# Patient Record
Sex: Male | Born: 1970
Health system: Southern US, Community
[De-identification: ages and names within clinical notes are randomized; demographics above are authoritative.]

## PROBLEM LIST (undated history)

## (undated) DIAGNOSIS — R000 Tachycardia, unspecified: Secondary | ICD-10-CM

## (undated) DIAGNOSIS — E7800 Pure hypercholesterolemia, unspecified: Secondary | ICD-10-CM

## (undated) DIAGNOSIS — G4733 Obstructive sleep apnea (adult) (pediatric): Secondary | ICD-10-CM

## (undated) DIAGNOSIS — M109 Gout, unspecified: Secondary | ICD-10-CM

## (undated) HISTORY — DX: Obstructive sleep apnea (adult) (pediatric): G47.33

## (undated) HISTORY — PX: PARTIAL KNEE ARTHROPLASTY: SHX2174

## (undated) HISTORY — PX: WISDOM TOOTH EXTRACTION: SHX21

## (undated) HISTORY — DX: Tachycardia, unspecified: R00.0

## (undated) HISTORY — DX: Gout, unspecified: M10.9

---

## 1999-02-01 ENCOUNTER — Emergency Department (HOSPITAL_COMMUNITY): Admission: EM | Admit: 1999-02-01 | Discharge: 1999-02-01 | Payer: Self-pay | Admitting: Emergency Medicine

## 1999-02-01 ENCOUNTER — Encounter: Payer: Self-pay | Admitting: Emergency Medicine

## 2014-06-20 ENCOUNTER — Emergency Department (HOSPITAL_COMMUNITY)
Admission: EM | Admit: 2014-06-20 | Discharge: 2014-06-21 | Disposition: A | Discharge status: Home / Self Care | Payer: 59 | Attending: Emergency Medicine | Admitting: Emergency Medicine

## 2014-06-20 ENCOUNTER — Emergency Department (HOSPITAL_COMMUNITY): Payer: 59

## 2014-06-20 ENCOUNTER — Encounter (HOSPITAL_COMMUNITY): Payer: Self-pay | Admitting: *Deleted

## 2014-06-20 DIAGNOSIS — Z8639 Personal history of other endocrine, nutritional and metabolic disease: Secondary | ICD-10-CM | POA: Insufficient documentation

## 2014-06-20 DIAGNOSIS — R197 Diarrhea, unspecified: Secondary | ICD-10-CM | POA: Diagnosis not present

## 2014-06-20 DIAGNOSIS — R112 Nausea with vomiting, unspecified: Secondary | ICD-10-CM | POA: Diagnosis not present

## 2014-06-20 DIAGNOSIS — R42 Dizziness and giddiness: Secondary | ICD-10-CM | POA: Diagnosis not present

## 2014-06-20 DIAGNOSIS — Z7982 Long term (current) use of aspirin: Secondary | ICD-10-CM | POA: Insufficient documentation

## 2014-06-20 DIAGNOSIS — R002 Palpitations: Secondary | ICD-10-CM | POA: Diagnosis not present

## 2014-06-20 DIAGNOSIS — Z79899 Other long term (current) drug therapy: Secondary | ICD-10-CM | POA: Diagnosis not present

## 2014-06-20 DIAGNOSIS — R55 Syncope and collapse: Secondary | ICD-10-CM | POA: Insufficient documentation

## 2014-06-20 HISTORY — DX: Pure hypercholesterolemia, unspecified: E78.00

## 2014-06-20 LAB — CBC
HCT: 51.5 % (ref 39.0–52.0)
Hemoglobin: 18.6 g/dL — ABNORMAL HIGH (ref 13.0–17.0)
MCH: 29.5 pg (ref 26.0–34.0)
MCHC: 36.1 g/dL — AB (ref 30.0–36.0)
MCV: 81.6 fL (ref 78.0–100.0)
Platelets: 194 10*3/uL (ref 150–400)
RBC: 6.31 MIL/uL — ABNORMAL HIGH (ref 4.22–5.81)
RDW: 13 % (ref 11.5–15.5)
WBC: 15.2 10*3/uL — ABNORMAL HIGH (ref 4.0–10.5)

## 2014-06-20 LAB — BASIC METABOLIC PANEL
Anion gap: 15 (ref 5–15)
BUN: 22 mg/dL (ref 6–23)
CALCIUM: 9.7 mg/dL (ref 8.4–10.5)
CHLORIDE: 102 mmol/L (ref 96–112)
CO2: 22 mmol/L (ref 19–32)
Creatinine, Ser: 1.32 mg/dL (ref 0.50–1.35)
GFR calc Af Amer: 74 mL/min — ABNORMAL LOW (ref >90)
GFR calc non Af Amer: 64 mL/min — ABNORMAL LOW (ref >90)
Glucose, Bld: 121 mg/dL — ABNORMAL HIGH (ref 70–99)
Potassium: 3.5 mmol/L (ref 3.5–5.1)
SODIUM: 139 mmol/L (ref 135–145)

## 2014-06-20 LAB — I-STAT TROPONIN, ED
Troponin i, poc: 0 ng/mL (ref 0.00–0.08)
Troponin i, poc: 0 ng/mL (ref 0.00–0.08)

## 2014-06-20 MED ORDER — ONDANSETRON HCL 4 MG/2ML IJ SOLN
4.0000 mg | Freq: Once | INTRAMUSCULAR | Status: AC
  Administered 2014-06-20: 4 mg via INTRAVENOUS
  Filled 2014-06-20: qty 2

## 2014-06-20 MED ORDER — SODIUM CHLORIDE 0.9 % IV BOLUS (SEPSIS)
1000.0000 mL | Freq: Once | INTRAVENOUS | Status: AC
  Administered 2014-06-20: 1000 mL via INTRAVENOUS

## 2014-06-20 MED ORDER — ACETAMINOPHEN 325 MG PO TABS
650.0000 mg | ORAL_TABLET | Freq: Once | ORAL | Status: AC
  Administered 2014-06-20: 650 mg via ORAL
  Filled 2014-06-20: qty 2

## 2014-06-20 NOTE — ED Notes (Signed)
Pt in c/o palpitations since around 6pm tonight, reports severe dizziness, pt pale on arrival, history of similar episodes but it normally resolves on its own- wife reports she checked his BP and HR at home, HR was 130-140

## 2014-06-20 NOTE — ED Notes (Signed)
While ambulating pt, pt O2 sat stayed at 97% the whole time. The only complaint pt had was a headache.

## 2014-06-20 NOTE — ED Provider Notes (Signed)
CSN: [id number]     Arrival date & time 06/20/14  1927 History   First MD Initiated Contact with Patient 06/20/14 2023     Chief Complaint  Patient presents with  . Palpitations  . Dizziness     Patient is a 44 y.o. male presenting with palpitations and dizziness. The history is provided by the patient.  Palpitations Palpitations quality:  Regular Onset quality:  Sudden Duration:  2 hours Timing:  Constant Progression:  Improving Chronicity:  New Relieved by:  Nothing Worsened by:  Nothing tried Associated symptoms: dizziness, nausea and vomiting   Associated symptoms: no chest pain, no shortness of breath and no syncope   Risk factors: no diabetes mellitus, no heart disease, no hx of DVT and no hx of PE   Dizziness Associated symptoms: diarrhea, nausea, palpitations and vomiting   Associated symptoms: no chest pain, no shortness of breath and no syncope   Patient reports that approximately 2 hours PTA he had onset of palpitations/tachycardia and also feeling dizzy No syncope but felt like he might "pass out" No CP He also reports episodes of vomiting and diarrhea He also reports "itching in my hands" He reports at home his HR>140 and his BP was elevated  He reports he has had this previously.  He has had it intermittently for 10 years He saw a cardiologist previously and had normal stress test He does not recall wearing a holter monitor    Past Medical History  Diagnosis Date  . High cholesterol    History reviewed. No pertinent past surgical history. History reviewed. No pertinent family history. History  Substance Use Topics  . Smoking status: Never Smoker   . Smokeless tobacco: Not on file  . Alcohol Use: Not on file    Review of Systems  Constitutional: Negative for fever.  Respiratory: Negative for shortness of breath.   Cardiovascular: Positive for palpitations. Negative for chest pain and syncope.  Gastrointestinal: Positive for nausea, vomiting and  diarrhea.  Neurological: Positive for dizziness. Negative for syncope.  All other systems reviewed and are negative.     Allergies  Review of patient's allergies indicates no known allergies.  Home Medications   Prior to Admission medications   Medication Sig Start Date End Date Taking? Authorizing Provider  aspirin 81 MG tablet Take 81 mg by mouth daily as needed for pain.   Yes Historical Provider, MD  diphenhydrAMINE (SOMINEX) 25 MG tablet Take 25 mg by mouth at bedtime as needed for allergies.   Yes Historical Provider, MD  febuxostat (ULORIC) 40 MG tablet Take 80 mg by mouth daily.   Yes Historical Provider, MD   BP 124/77 mmHg  Pulse 101  Temp(Src) 97.4 F (36.3 C) (Oral)  Resp 19  Ht  (1.803 m)  Wt 220 lb (99.791 kg)  BMI 30.70 kg/m2  SpO2 100% Physical Exam CONSTITUTIONAL: Well developed/well nourished HEAD: Normocephalic/atraumatic EYES: EOMI/PERRL ENMT: Mucous membranes moist NECK: supple no meningeal signs SPINE/BACK:entire spine nontender CV: tachycardic S1/S2 noted, no murmurs/rubs/gallops noted LUNGS: Lungs are clear to auscultation bilaterally, no apparent distress ABDOMEN: soft, nontender, no rebound or guarding, bowel sounds noted throughout abdomen GU:no cva tenderness NEURO: Pt is awake/alert/appropriate, moves all extremitiesx4.  No facial droop.   EXTREMITIES: pulses normal/equal, full ROM, no calf tenderness is noted SKIN: warm, color normal PSYCH: no abnormalities of mood noted, alert and oriented to situation  ED Course  Procedures   9:41 PM Pt with palpitations/diarrhea/vomiting He denies CP He is in  no distress Will perform 3 hrs troponin to no ensure no signs of MI I doubt PE (denies SOB at this time, has had this previously) 11:38 PM Pt feels much improved He is ambulatory without any hypoxia He denies CP Given h/o palptiations/diarrhea and long h/o this in the past, i doubt acute cardiac dysrhythmia and I doubt acute PE (no CP  reported, no hypoxia on RA when I am in the room, suspect vital tab is error) Pt agreeable with this plan Discussed strict return precautions  Labs Review Labs Reviewed  CBC - Abnormal; Notable for the following:    WBC 15.2 (*)    RBC 6.31 (*)    Hemoglobin 18.6 (*)    MCHC 36.1 (*)    All other components within normal limits  BASIC METABOLIC PANEL - Abnormal; Notable for the following:    Glucose, Bld 121 (*)    GFR calc non Af Amer 64 (*)    GFR calc Af Amer 74 (*)    All other components within normal limits  I-STAT TROPOININ, ED  Rosezena SensorI-STAT TROPOININ, ED    Imaging Review Dg Chest Port 1 View  06/20/2014   CLINICAL DATA:  Chest palpitations beginning at 6:00 p.m. today.  EXAM: PORTABLE CHEST - 1 VIEW  COMPARISON:  None.  FINDINGS: Heart size and mediastinal contours are within normal limits. Both lungs are clear. Visualized skeletal structures are unremarkable.  IMPRESSION: Negative exam.   Electronically Signed   By: Drusilla Kannerhomas  Dalessio M.D.   On: 06/20/2014 19:53     EKG Interpretation   Date/Time:  Saturday June 20 2014 19:32:11 EST Ventricular Rate:  113 PR Interval:  140 QRS Duration: 84 QT Interval:  306 QTC Calculation: 419 R Axis:   38 Text Interpretation:  Sinus tachycardia T wave abnormality, consider  inferior ischemia Abnormal ECG No significant change since last tracing  Confirmed by Bebe ShaggyWICKLINE  MD, Malyiah Fellows ([id number]) on 06/20/2014 8:23:10 PM     Medications  ondansetron (ZOFRAN) injection 4 mg (4 mg Intravenous Given 06/20/14 2001)  sodium chloride 0.9 % bolus 1,000 mL (0 mLs Intravenous Stopped 06/20/14 2201)  acetaminophen (TYLENOL) tablet 650 mg (650 mg Oral Given 06/20/14 2052)  sodium chloride 0.9 % bolus 1,000 mL (0 mLs Intravenous Stopped 06/20/14 2314)    MDM   Final diagnoses:  Palpitations  Diarrhea  Near syncope    Nursing notes including past medical history and social history reviewed and considered in documentation xrays/imaging reviewed by  myself and considered during evaluation Labs/vital reviewed myself and considered during evaluation     Joya Gaskinsonald W Quay Simkin, MD 06/20/14 2340

## 2014-07-29 ENCOUNTER — Ambulatory Visit (INDEPENDENT_AMBULATORY_CARE_PROVIDER_SITE_OTHER): Payer: 59 | Admitting: Neurology

## 2014-07-29 ENCOUNTER — Encounter: Payer: Self-pay | Admitting: Neurology

## 2014-07-29 VITALS — BP 113/79 | HR 74 | Temp 96.9°F | Resp 20 | Ht 71.0 in | Wt 221.0 lb

## 2014-07-29 DIAGNOSIS — R002 Palpitations: Secondary | ICD-10-CM

## 2014-07-29 DIAGNOSIS — R519 Headache, unspecified: Secondary | ICD-10-CM

## 2014-07-29 DIAGNOSIS — E669 Obesity, unspecified: Secondary | ICD-10-CM | POA: Diagnosis not present

## 2014-07-29 DIAGNOSIS — R51 Headache: Secondary | ICD-10-CM | POA: Diagnosis not present

## 2014-07-29 DIAGNOSIS — G4733 Obstructive sleep apnea (adult) (pediatric): Secondary | ICD-10-CM | POA: Diagnosis not present

## 2014-07-29 NOTE — Patient Instructions (Signed)

## 2014-07-29 NOTE — Progress Notes (Signed)
Subjective:    Patient ID: Chad Moran is a 44 y.o. male.  HPI     Chad Foley, MD, PhD [facility] [address] [address] [place], [place] [address]  Dear Dr. Link Moran,   I saw your patient, Chad Moran, upon your kind request in my neurologic clinic today for initial consultation of his sleep disorder, in particular, concern for underlying obstructive sleep apnea. The patient is unaccompanied today. As you know, Chad Moran is a very pleasant 44 year old right-handed gentleman with an underlying medical history of obesity, palpitations, and hypertriglyceridemia, with recent emergency room visit on 06/20/2014 with negative cardiac workup, who was reported to snore and have witnessed apneas while asleep per wife's report. He is significantly sleepy but has a hard time sleeping or napping. He works full-time as a IT sales professional. He snores loudly at times. Often he has more than 5 awakenings at night and is not rested when he first wakes up. He has to get up for work around 6:30 and does not feel good. He has a variable bedtime. He may not fall asleep quickly at all and then may wake up 5-6 times at night. He does not drink a lot of caffeine maybe 1-2 cups per day. He does not smoke and he does not drink alcohol on a regular basis. His father snores loudly but has not been tested for OSA. His mother has a history of lung cancer, thyroid disease, and lupus as well as COPD. He went to the emergency room with dizziness, palpitations, flushing, itching of his palms and hands and nausea as well as significant diarrhea. He has had similar episodes infrequently before. He has seen cardiology some 10 years ago and was told he may have irregular heartbeat. He denies any parasomnias. His Epworth sleepiness score is 17 out of 24 today. He has woken himself up with a sense of gasping for air. He is not sure if he twitches or kicks in his sleep.  His Past Medical History Is  Significant For: Past Medical History  Diagnosis Date  . High cholesterol   . Tachycardia   . Gout     His Past Surgical History Is Significant For: No past surgical history on file.  His Family History Is Significant For: Family History  Problem Relation Age of Onset  . Lung cancer Mother   . Hypertension Mother   . Lupus Mother   . COPD Mother   . Arthritis Father     His Social History Is Significant For: History   Social History  . Marital Status: Married    Spouse Name: Chad Moran  . Number of Children: N/A  . Years of Education: Associates   Occupational History  . [employer]    [employer]   Social History Main Topics  . Smoking status: Never Smoker   . Smokeless tobacco: Not on file  . Alcohol Use: No  . Drug Use: No  . Sexual Activity: Not on file   Other Topics Concern  . None   Social History Narrative   2 caffeine drinks a day (soda or tea)    His Allergies Are:  No Known Allergies:   His Current Medications Are:  Outpatient Encounter Prescriptions as of 07/29/2014  Medication Sig  . aspirin 81 MG tablet Take 81 mg by mouth daily as needed for pain.  . febuxostat (ULORIC) 40 MG tablet Take 80 mg by mouth daily.  . Fenofibrate 120 MG TABS Take by  mouth.  . [DISCONTINUED] diphenhydrAMINE (SOMINEX) 25 MG tablet Take 25 mg by mouth at bedtime as needed for allergies.  :  Review of Systems:  Out of a complete 14 point review of systems, all are reviewed and negative with the exception of these symptoms as listed below:   Review of Systems  Constitutional: Positive for fatigue.  Cardiovascular: Positive for palpitations.  Musculoskeletal:       Joint pain   Neurological:       Insomnia, Sleepiness, Snoring   Psychiatric/Behavioral:       Not enough sleep and decreased energy     Objective:  Neurologic Exam  Physical Exam Physical Examination:   Filed Vitals:   07/29/14 0813  BP: 113/79  Pulse: 74  Temp: 96.9 F (36.1  C)  Resp: 20    General Examination: The patient is a very pleasant 44 y.o. male in no acute distress. He appears well-developed and well-nourished and well groomed.   HEENT: Normocephalic, atraumatic, pupils are equal, round and reactive to light and accommodation. Funduscopic exam is normal with sharp disc margins noted. Extraocular tracking is good without limitation to gaze excursion or nystagmus noted. Normal smooth pursuit is noted. Hearing is grossly intact. Tympanic membranes are clear bilaterally. Face is symmetric with normal facial animation and normal facial sensation. Speech is clear with no dysarthria noted. There is no hypophonia. There is no lip, neck/head, jaw or voice tremor. Neck is supple with full range of passive and active motion. There are no carotid bruits on auscultation. Oropharynx exam reveals: mild mouth dryness, good dental hygiene and moderate airway crowding, due to thicker soft palate and enlarged uvula. Tonsils are 1+ bilaterally. Mallampati is class I.neck size is 16-3/4 inches. Tongue protrudes centrally and palate elevates symmetrically.    Chest: Clear to auscultation without wheezing, rhonchi or crackles noted.  Heart: S1+S2+0, regular and normal without murmurs, rubs or gallops noted.   Abdomen: Soft, non-tender and non-distended with normal bowel sounds appreciated on auscultation.  Extremities: There is no pitting edema in the distal lower extremities bilaterally. Pedal pulses are intact.  Skin: Warm and dry without trophic changes noted. There are no varicose veins.  Musculoskeletal: exam reveals no obvious joint deformities, tenderness or joint swelling or erythema.   Neurologically:  Mental status: The patient is awake, alert and oriented in all 4 spheres. His immediate and remote memory, attention, language skills and fund of knowledge are appropriate. There is no evidence of aphasia, agnosia, apraxia or anomia. Speech is clear with normal prosody  and enunciation. Thought process is linear. Mood is normal and affect is normal.  Cranial nerves II - XII are as described above under HEENT exam. In addition: shoulder shrug is normal with equal shoulder height noted. Motor exam: Normal bulk, strength and tone is noted. There is no drift, tremor or rebound. Romberg is negative. Reflexes are 2+ throughout. Babinski: Toes are flexor bilaterally. Fine motor skills and coordination: intact with normal finger taps, normal hand movements, normal rapid alternating patting, normal foot taps and normal foot agility.  Cerebellar testing: No dysmetria or intention tremor on finger to nose testing. Heel to shin is unremarkable bilaterally. There is no truncal or gait ataxia.  Sensory exam: intact to light touch, pinprick, vibration, temperature sense in the upper and lower extremities.  Gait, station and balance: He stands easily. No veering to one side is noted. No leaning to one side is noted. Posture is age-appropriate and stance is narrow based. Gait shows  normal stride length and normal pace. No problems turning are noted. He turns en bloc. Tandem walk is unremarkable.           Assessment and Plan:  In summary, Chad Moran is a very pleasant 44 y.o.-year old male with an underlying medical history of obesity, palpitations, and hypertriglyceridemia, whose history and physical exam are in keeping with obstructive sleep apnea (OSA). I had a long chat with the patient about my findings and the diagnosis of OSA, its prognosis and treatment options. We talked about medical treatments, surgical interventions and non-pharmacological approaches. I explained in particular the risks and ramifications of untreated moderate to severe OSA, especially with respect to developing cardiovascular disease down the Road, including congestive heart failure, difficult to treat hypertension, cardiac arrhythmias, or stroke. Even type 2 diabetes has, in part, been linked to untreated  OSA. Symptoms of untreated OSA include daytime sleepiness, memory problems, mood irritability and mood disorder such as depression and anxiety, lack of energy, as well as recurrent headaches, especially morning headaches. We talked about trying to maintain a healthy lifestyle in general, as well as the importance of weight control. I encouraged the patient to eat healthy, exercise daily and keep well hydrated, to keep a scheduled bedtime and wake time routine, to not skip any meals and eat healthy snacks in between meals. I advised the patient not to drive when feeling sleepy. I recommended the following at this time: sleep study with potential positive airway pressure titration. (We will score hypopneas at 4% and split the sleep study into diagnostic and treatment portion, if the estimated. 2 hour AHI is >20/h).   I explained the sleep test procedure to the patient and also outlined possible surgical and non-surgical treatment options of OSA, including the use of a custom-made dental device (which would require a referral to a specialist dentist or oral surgeon), upper airway surgical options, such as pillar implants, radiofrequency surgery, tongue base surgery, and UPPP (which would involve a referral to an ENT surgeon). Rarely, jaw surgery such as mandibular advancement may be considered.  I also explained the CPAP treatment option to the patient, who indicated that he would be willing to try CPAP if the need arises. I explained the importance of being compliant with PAP treatment, not only for insurance purposes but primarily to improve His symptoms, and for the patient's long term health benefit, including to reduce His cardiovascular risks. I answered all his questions today and the patient was in agreement. I would like to see him back after the sleep study is completed and encouraged him to call with any interim questions, concerns, problems or updates.   Thank you very much for allowing me to  participate in the care of this nice patient. If I can be of any further assistance to you please do not hesitate to call me at [phone].  Sincerely,   Chad Foley, MD, PhD

## 2014-08-18 ENCOUNTER — Ambulatory Visit (INDEPENDENT_AMBULATORY_CARE_PROVIDER_SITE_OTHER): Payer: 59 | Admitting: Neurology

## 2014-08-18 DIAGNOSIS — G479 Sleep disorder, unspecified: Secondary | ICD-10-CM

## 2014-08-18 DIAGNOSIS — G4733 Obstructive sleep apnea (adult) (pediatric): Secondary | ICD-10-CM | POA: Diagnosis not present

## 2014-08-18 DIAGNOSIS — G4710 Hypersomnia, unspecified: Secondary | ICD-10-CM

## 2014-08-18 DIAGNOSIS — G4730 Sleep apnea, unspecified: Secondary | ICD-10-CM

## 2014-08-19 NOTE — Sleep Study (Signed)
Please see the scanned sleep study interpretation located in the Procedure tab within the Chart Review section. 

## 2014-09-02 ENCOUNTER — Telehealth: Payer: Self-pay | Admitting: Neurology

## 2014-09-02 DIAGNOSIS — G4733 Obstructive sleep apnea (adult) (pediatric): Secondary | ICD-10-CM

## 2014-09-02 NOTE — Telephone Encounter (Signed)
Lafonda MossesDiana:   Please call and notify patient that the recent sleep study confirmed the diagnosis of OSA. He did well with CPAP during the study with improvement of the respiratory events. Therefore, I would like start the patient on CPAP at home. I placed the order in the chart. The patient will need a follow up appointment with me in 8 to 10 weeks post set up that has to be scheduled; please go ahead and schedule while you have the patient on the phone and make sure patient understands the importance of keeping this window for the FU appointment, as it is often an insurance requirement and failing to adhere to this may result in losing coverage for sleep apnea treatment. 15 min follow-up should suffice, unless there is a 30 min FU slot available.  Please re-enforce the importance of compliance with treatment and the need for us to monitor compliance data - again an insurance requirement.  Also remind patient, that any upcoming CPAP machine or mask issues, should be first addressed with the DME company. Please ask if patient has a preference regarding DME company and note on phone note for Onalee HuaDavid. Route ph note to Onalee Huaavid.   David:   Please arrange for CPAP set up at home through a DME company of patient's choice - once Lafonda MossesDiana has spoken to patient - and fax/route report to PCP and referring MD (if other than PCP).    Once Lafonda MossesDiana has spoken to patient and scheduled the FU appt (or the new patient consultation) and Onalee HuaDavid has sent off DME order and scanned the paperwork, Onalee HuaDavid can close this encounter, thanks,   Huston FoleySaima Corene Resnick, MD, PhD [facility]

## 2014-09-03 ENCOUNTER — Encounter: Payer: Self-pay | Admitting: Neurology

## 2014-09-03 NOTE — Telephone Encounter (Signed)
Patient returning call regarding Sleep Study results.

## 2014-09-03 NOTE — Telephone Encounter (Signed)
I spoke to [place], he is aware of sleep study results. He would like to proceed with getting a CPAP machine.

## 2014-09-03 NOTE — Telephone Encounter (Signed)
Spoke with patient and referred [facility] to [facility].

## 2014-09-03 NOTE — Telephone Encounter (Signed)
Called patient to give Sleep study results. No answer and no vm. I will try back later.

## 2014-10-29 ENCOUNTER — Encounter: Payer: Self-pay | Admitting: Neurology

## 2014-11-03 ENCOUNTER — Encounter: Payer: Self-pay | Admitting: Neurology

## 2014-11-03 ENCOUNTER — Ambulatory Visit (INDEPENDENT_AMBULATORY_CARE_PROVIDER_SITE_OTHER): Payer: 59 | Admitting: Neurology

## 2014-11-03 VITALS — BP 123/79 | HR 74 | Ht 71.0 in | Wt 223.0 lb

## 2014-11-03 DIAGNOSIS — E669 Obesity, unspecified: Secondary | ICD-10-CM

## 2014-11-03 DIAGNOSIS — G4733 Obstructive sleep apnea (adult) (pediatric): Secondary | ICD-10-CM | POA: Diagnosis not present

## 2014-11-03 DIAGNOSIS — Z9989 Dependence on other enabling machines and devices: Principal | ICD-10-CM

## 2014-11-03 NOTE — Progress Notes (Signed)
Subjective:    Patient ID: PENIEL HASS is a 44 y.o. male.  HPI     Interim history:   Mr. Granlund is a very pleasant 44 year old right-handed gentleman with an underlying medical history of obesity, palpitations, and hypertriglyceridemia (emergency room visit on 06/20/2014 with negative cardiac workup), who presents for follow-up consultation of his obstructive sleep apnea, after his recent sleep study. The patient is unaccompanied today. I first met him on 07/29/2014 at the request of his primary care physician, at which time the patient reported snoring and witnessed apneas while asleep. I invited him back for sleep study. He had a split-night sleep study on 08/18/2014 and I went over his test results with him in detail today. His baseline sleep efficiency was 67.8% with a latency to sleep of 34.5 minutes and wake after sleep onset of 39 minutes with severe sleep fragmentation noted. The arousal index was 24.5 per hour, sleep distribution showed an increased percentage of stage I sleep, and a prolonged REM latency. He had mild to moderate snoring. He slept primarily on his back. Total AHI was 36.5 per hour. Baseline oxygen saturation 92%, nadir was 86%. He was then titrated on CPAP from a pressure of 5 cm to 11 cm. The AHI was 3.6 per hour and a pressure of 9 cm. REM sleep was not achieved during the treatment portion of the study. No significant PLMS were noted during the study. Based on his test results are prescribed CPAP therapy for home use at a pressure of 9 cm.  Today, 11/03/2014: I reviewed his CPAP compliance data from 09/30/2014 through 10/29/2014 which is a total of 30 days during which time he used his machine every night with percent used days greater than 4 hours at 83%, indicating very good compliance with an average usage of 5 hours and 18 minutes, residual AHI acceptable at 2 per hour, leak acceptable with the 95th percentile at 17.4 L/m on a pressure of 9 cm with EPR of  2.  Today, 11/03/2014: He reports still trying to get adjusted to treatment. He indicates compliance with treatment and has noticed some smaller improvements in his sleep including sleep consolidation. He has not had any recent palpitations. He has had no recent morning headaches. He does admit that he may not allow for enough sleep time. He may average at times only 4 hours of sleep. He is trying to lose weight. He has not noticed a day and night difference but overall is motivated to continue with treatment as he has seen some smaller improvements. He has had some mouth dryness especially when he ran out of water in the humidifier. Otherwise it is okay. He does use a fullface mask at this time which she preferred at the time of set up with CPAP therapy. Sometimes he feels he does not get enough air. We talked about his CPAP compliance data and the residual AHI at 2 per hour indicates an adequate pressure but sometimes patients feel better with a slightly higher pressure. We can certainly increase it for him and see how he feels.  Previously:  He is reported to snore and have witnessed apneas while asleep per wife's report. He is significantly sleepy but has a hard time sleeping or napping. He works full-time as a Airline pilot. He snores loudly at times. Often he has more than 5 awakenings at night and is not rested when he first wakes up. He has to get up for work around 6:30 and does not  feel good. He has a variable bedtime. He may not fall asleep quickly at all and then may wake up 5-6 times at night. He does not drink a lot of caffeine maybe 1-2 cups per day. He does not smoke and he does not drink alcohol on a regular basis. His father snores loudly but has not been tested for OSA. His mother has a history of lung cancer, thyroid disease, and lupus as well as COPD. He went to the emergency room with dizziness, palpitations, flushing, itching of his palms and hands and nausea as well as significant  diarrhea. He has had similar episodes infrequently before. He has seen cardiology some 10 years ago and was told he may have irregular heartbeat. He denies any parasomnias. His Epworth sleepiness score is 17 out of 24 today. He has woken himself up with a sense of gasping for air. He is not sure if he twitches or kicks in his sleep.  His Past Medical History Is Significant For: Past Medical History  Diagnosis Date  . High cholesterol   . Tachycardia   . Gout     His Past Surgical History Is Significant For: Past Surgical History  Procedure Laterality Date  . Wisdom tooth extraction      His Family History Is Significant For: Family History  Problem Relation Age of Onset  . Lung cancer Mother   . Hypertension Mother   . Lupus Mother   . COPD Mother   . Arthritis Father     His Social History Is Significant For: History   Social History  . Marital Status: Married    Spouse Name: Anderson Malta  . Number of Children: 2  . Years of Education: Associates   Occupational History  . [place] History Main Topics  . Smoking status: Never Smoker   . Smokeless tobacco: Never Used  . Alcohol Use: No  . Drug Use: No  . Sexual Activity: Not on file   Other Topics Concern  . None   Social History Narrative   Patient lives at home with his wife Anderson Malta).   Education college.   Left handed.   Caffeine sweet tea three daily.    His Allergies Are:  Allergies  Allergen Reactions  . Naproxen   :   His Current Medications Are:  Outpatient Encounter Prescriptions as of 11/03/2014  Medication Sig  . aspirin 81 MG tablet Take 81 mg by mouth daily as needed for pain.  . febuxostat (ULORIC) 40 MG tablet Take 80 mg by mouth daily.  . Fenofibrate 120 MG TABS Take by mouth.   No facility-administered encounter medications on file as of 11/03/2014.  :  Review of Systems:  Out of a complete 14 point review of systems, all are reviewed and  negative with the exception of these symptoms as listed below:    Review of Systems  Neurological: Positive for headaches.  Psychiatric/Behavioral: Positive for sleep disturbance.    Objective:  Neurologic Exam  Physical Exam Physical Examination:   Filed Vitals:   11/03/14 1138  BP: 123/79  Pulse: 74   General Examination: The patient is a very pleasant 44 y.o. male in no acute distress. He appears well-developed and well-nourished and well groomed.   HEENT: Normocephalic, atraumatic, pupils are equal, round and reactive. Extraocular tracking is good without limitation to gaze excursion or nystagmus noted. Normal smooth pursuit is noted. Hearing is grossly intact. Tympanic membranes are clear bilaterally.  Face is symmetric with normal facial animation and normal facial sensation. Speech is clear with no dysarthria noted. There is no hypophonia. There is no lip, neck/head, jaw or voice tremor. Neck is supple with full range of passive and active motion. There are no carotid bruits on auscultation.   Chest: Clear to auscultation without wheezing, rhonchi or crackles noted.  Heart: S1+S2+0, regular and normal without murmurs, rubs or gallops noted.   Abdomen: Soft, non-tender and non-distended with normal bowel sounds appreciated on auscultation.  Extremities: There is no pitting edema in the distal lower extremities bilaterally.  Skin: Warm and dry without trophic changes noted. There are no varicose veins.  Musculoskeletal: exam reveals no obvious joint deformities, tenderness or joint swelling or erythema.   Neurologically:  Mental status: The patient is awake, alert and oriented in all 4 spheres. His immediate and remote memory, attention, language skills and fund of knowledge are appropriate. There is no evidence of aphasia, agnosia, apraxia or anomia. Speech is clear with normal prosody and enunciation. Thought process is linear. Mood is normal and affect is normal.  Cranial  nerves II - XII are as described above under HEENT exam. In addition: shoulder shrug is normal with equal shoulder height noted. Motor exam: Normal bulk, strength and tone is noted. Fine motor skills and coordination: intact.  Cerebellar testing: No dysmetria or intention tremor on finger to nose testing. Heel to shin is unremarkable bilaterally. There is no truncal or gait ataxia.  Sensory exam: intact to light touch.  Gait, station and balance: Normal.     Assessment and Plan:  In summary, MANNIE OHLIN is a very pleasant 44 year old male with an underlying medical history of obesity, palpitations, and hypertriglyceridemia, who presents for follow-up consultation of his obstructive sleep apnea. He had a recent split-night sleep study on 08/18/2014 and we discussed the findings. Findings were in keeping with severe obstructive sleep disordered breathing based on number of events. He did fairly well with CPAP therapy. He is still adjusted to treatment but has noticed some smaller improvements. He is encouraged to continue with treatment and congratulated for his treatment adherence. He may be able to try nasal mask once he is up for a new mask. He may not need to continue with a full face mask which he initially preferred. For comfort we can certainly try to increase the pressure to 10 cm at this time and we will fax the request to his DME company. His physical exam is stable. I again explained to him the risks and ramifications of untreated moderate to severe OSA, especially with respect to developing cardiovascular disease down the Road, including congestive heart failure, difficult to treat hypertension, cardiac arrhythmias, or stroke. Even type 2 diabetes has, in part, been linked to untreated OSA. Symptoms of untreated OSA include daytime sleepiness, memory problems, mood irritability and mood disorder such as depression and anxiety, lack of energy, as well as recurrent headaches, especially morning  headaches.  We again talked about trying to maintain a healthy lifestyle in general, as well as the importance of weight control. I explained the importance of being compliant with PAP treatment, not only for insurance purposes but primarily to improve His symptoms, and for the patient's long term health benefit, including to reduce His cardiovascular risks. I would like to see him back in 6 months, sooner if the need arises. If he does well I can see him back on a yearly basis after that. I answered all his  questions today and he was in agreement.  I spent 20 minutes in total face-to-face time with the patient, more than 50% of which was spent in counseling and coordination of care, reviewing test results, reviewing medication and discussing or reviewing the diagnosis of OSA, its prognosis and treatment options.

## 2014-11-03 NOTE — Patient Instructions (Signed)

## 2015-04-13 ENCOUNTER — Telehealth: Payer: Self-pay

## 2015-04-13 NOTE — Telephone Encounter (Signed)
Pt called back. This operator moved his appt time to 12/15 at 10am as previous tele note suggested. Pt was ok with the move.

## 2015-04-13 NOTE — Telephone Encounter (Signed)
Left message for patient. He has appt 12/15 at 11:15 for 15min but Dr. Frances FurbishAthar only has 30min appt. I left him a message asking him if we can move appt to 10am on same day or anytime on another day.

## 2015-05-06 ENCOUNTER — Encounter: Payer: Self-pay | Admitting: Neurology

## 2015-05-06 ENCOUNTER — Ambulatory Visit (INDEPENDENT_AMBULATORY_CARE_PROVIDER_SITE_OTHER): Payer: 59 | Admitting: Neurology

## 2015-05-06 ENCOUNTER — Ambulatory Visit: Payer: 59 | Admitting: Neurology

## 2015-05-06 VITALS — BP 121/74 | HR 72 | Resp 16 | Ht 71.0 in | Wt 223.0 lb

## 2015-05-06 DIAGNOSIS — E669 Obesity, unspecified: Secondary | ICD-10-CM

## 2015-05-06 DIAGNOSIS — G4733 Obstructive sleep apnea (adult) (pediatric): Secondary | ICD-10-CM

## 2015-05-06 DIAGNOSIS — Z9989 Dependence on other enabling machines and devices: Principal | ICD-10-CM

## 2015-05-06 NOTE — Progress Notes (Signed)
Subjective:    Patient ID: Chad Moran is a 44 y.o. male.  HPI     Interim history:   Mr. Chad Moran is a very pleasant 44 year old right-handed gentleman with an underlying medical history of obesity, palpitations, and hypertriglyceridemia (emergency room visit on 06/20/2014 with negative cardiac workup), who presents for follow-up consultation of his obstructive sleep apnea, on treatment with CPAP. The patient is unaccompanied today. I last saw him on 11/03/2014, at which time he reported that he was still trying to get adjusted to treatment. He was compliant with treatment. He had noticed some improvement of his sleep including improvement of morning headaches. He was trying to lose weight. He was motivated to continue with treatment.   Today, 05/06/2015: I reviewed his CPAP compliance data from 04/04/2015 through 05/03/2015 which is a total of 30 days during which time he used his machine 26 days with percent used days greater than 4 hours of only 50%, indicating suboptimal compliance with an average usage of 3 hours and 11 minutes only, residual AHI 2.5 per hour, leak acceptable with the 95th percentile at 17.2 L/m on a pressure of 9 cm with EPR of 1.  Today, 05/06/2015: He reports doing fairly well. He is in school for his psychology degree and works full-time as a Airline pilot as well. He is not always getting enough sleep. He is also pulling off his mask in the middle of the night and does not always realize it. He did change his mask to a different full face mask which he likes better than the first one. He also tried a nasal mask but could not tolerate it. He feels that he does better with a full facemask. Nevertheless, sometimes he wakes up with a leak blowing into his face and as a result tightens the mask further. This may be the reason why he pulls his mask off when he is asleep because it may bother him at the time and may be too tight. His stress level is a little bit higher than usual  because of his school. He is planning on changing his classes some to a more manageable degree next semester. Both his kids are in college. His daughter will soon transfer from [place] to [employer]. His son is in [place] and also has a job there.   Previously:   I first met him on 07/29/2014 at the request of his primary care physician, at which time the patient reported snoring and witnessed apneas while asleep. I invited him back for sleep study. He had a split-night sleep study on 08/18/2014 and I went over his test results with him in detail today. His baseline sleep efficiency was 67.8% with a latency to sleep of 34.5 minutes and wake after sleep onset of 39 minutes with severe sleep fragmentation noted. The arousal index was 24.5 per hour, sleep distribution showed an increased percentage of stage I sleep, and a prolonged REM latency. He had mild to moderate snoring. He slept primarily on his back. Total AHI was 36.5 per hour. Baseline oxygen saturation 92%, nadir was 86%. He was then titrated on CPAP from a pressure of 5 cm to 11 cm. The AHI was 3.6 per hour and a pressure of 9 cm. REM sleep was not achieved during the treatment portion of the study. No significant PLMS were noted during the study. Based on his test results are prescribed CPAP therapy for home use at a pressure of 9 cm.  I reviewed his CPAP  compliance data from 09/30/2014 through 10/29/2014 which is a total of 30 days during which time he used his machine every night with percent used days greater than 4 hours at 83%, indicating very good compliance with an average usage of 5 hours and 18 minutes, residual AHI acceptable at 2 per hour, leak acceptable with the 95th percentile at 17.4 L/m on a pressure of 9 cm with EPR of 2.  He is reported to snore and have witnessed apneas while asleep per wife's report. He is significantly sleepy but has a hard time sleeping or napping. He works full-time as a Airline pilot. He snores  loudly at times. Often he has more than 5 awakenings at night and is not rested when he first wakes up. He has to get up for work around 6:30 and does not feel good. He has a variable bedtime. He may not fall asleep quickly at all and then may wake up 5-6 times at night. He does not drink a lot of caffeine maybe 1-2 cups per day. He does not smoke and he does not drink alcohol on a regular basis. His father snores loudly but has not been tested for OSA. His mother has a history of lung cancer, thyroid disease, and lupus as well as COPD. He went to the emergency room with dizziness, palpitations, flushing, itching of his palms and hands and nausea as well as significant diarrhea. He has had similar episodes infrequently before. He has seen cardiology some 10 years ago and was told he may have irregular heartbeat. He denies any parasomnias. His Epworth sleepiness score is 17 out of 24 today. He has woken himself up with a sense of gasping for air. He is not sure if he twitches or kicks in his sleep.   His Past Medical History Is Significant For: Past Medical History  Diagnosis Date  . High cholesterol   . Tachycardia   . Gout     His Past Surgical History Is Significant For: Past Surgical History  Procedure Laterality Date  . Wisdom tooth extraction      His Family History Is Significant For: Family History  Problem Relation Age of Onset  . Lung cancer Mother   . Hypertension Mother   . Lupus Mother   . COPD Mother   . Arthritis Father     His Social History Is Significant For: Social History   Social History  . Marital Status: Married    Spouse Name: Anderson Malta  . Number of Children: 2  . Years of Education: Associates   Occupational History  . [place] History Main Topics  . Smoking status: Never Smoker   . Smokeless tobacco: Never Used  . Alcohol Use: No  . Drug Use: No  . Sexual Activity: Not Asked   Other Topics Concern  . None    Social History Narrative   Patient lives at home with his wife Anderson Malta).   Education college.   Left handed.   Caffeine sweet tea three daily.    His Allergies Are:  Allergies  Allergen Reactions  . Naproxen   :   His Current Medications Are:  Outpatient Encounter Prescriptions as of 05/06/2015  Medication Sig  . aspirin 81 MG tablet Take 81 mg by mouth daily as needed for pain.  . febuxostat (ULORIC) 40 MG tablet Take 80 mg by mouth daily.  . Fenofibrate 120 MG TABS Take by mouth.   No facility-administered encounter  medications on file as of 05/06/2015.  :  Review of Systems:  Out of a complete 14 point review of systems, all are reviewed and negative with the exception of these symptoms as listed below:   Review of Systems  Neurological:       Patient is here for f/u. No new concerns or problems.     Objective:  Neurologic Exam  Physical Exam Physical Examination:   Filed Vitals:   05/06/15 0950  BP: 121/74  Pulse: 72  Resp: 16   General Examination: The patient is a very pleasant 44 y.o. male in no acute distress. He appears well-developed and well-nourished and well groomed.   HEENT: Normocephalic, atraumatic, pupils are equal, round and reactive. Extraocular tracking is good without limitation to gaze excursion or nystagmus noted. Normal smooth pursuit is noted. Hearing is grossly intact. Face is symmetric with normal facial animation and normal facial sensation. Speech is clear with no dysarthria noted. There is no hypophonia. There is no lip, neck/head, jaw or voice tremor. Neck is supple with full range of passive and active motion. There are no carotid bruits on auscultation.   Chest: Clear to auscultation without wheezing, rhonchi or crackles noted.  Heart: S1+S2+0, regular and normal without murmurs, rubs or gallops noted.   Abdomen: Soft, non-tender and non-distended with normal bowel sounds appreciated on auscultation.  Extremities: There is no  pitting edema in the distal lower extremities bilaterally.  Skin: Warm and dry without trophic changes noted. There are no varicose veins.  Musculoskeletal: exam reveals no obvious joint deformities, tenderness or joint swelling or erythema.   Neurologically:  Mental status: The patient is awake, alert and oriented in all 4 spheres. His immediate and remote memory, attention, language skills and fund of knowledge are appropriate. There is no evidence of aphasia, agnosia, apraxia or anomia. Speech is clear with normal prosody and enunciation. Thought process is linear. Mood is normal and affect is normal.  Cranial nerves II - XII are as described above under HEENT exam. In addition: shoulder shrug is normal with equal shoulder height noted. Motor exam: Normal bulk, strength and tone is noted. Fine motor skills and coordination: intact. Romberg is negative.  Cerebellar testing: No dysmetria or intention tremor on finger to nose testing. Heel to shin is unremarkable bilaterally. There is no truncal or gait ataxia.  Sensory exam: intact to light touch.  Gait, station and balance: Normal and tandem walk is unremarkable.    Assessment and Plan:  In summary, SYDNEY AZURE is a very pleasant 44 year old male with an underlying medical history of obesity, palpitations, and hypertriglyceridemia, who presents for follow-up consultation of his severe obstructive sleep apnea by number of events, treated with CPAP. He had a split-night sleep study on 08/18/2014 and we reviewed the findings again today. We also reviewed his most recent 30 day compliance data and he has been suboptimally compliant at this time. He was better before, up to 83% compliance. He has had more stress and has had less total sleep time because of working full-time as well as going to school. He is planning on reducing his schoolwork load next semester. He has noticed some  improvements in his sleep. He  tried a nasal mask but likes the full  facemask better. The most recent kind is more tolerable to him. He is advised not to tighten the mass to much. This may be why he pulls off the mask in the middle of the night because it  causes pain and discomfort. We talked about increasing the pressure to 10 cm before but he is well treated on the current pressure and has gotten used to the pressure at this setting. We talked about potentially repeating a sleep study with full night CPAP in the near future. For now, we mutually agreed to monitor symptoms and compliance. He will follow-up in about 6 months, sooner if needed. His physical exam is stable.  We again talked about trying to maintain a healthy lifestyle in general, as well as the importance of weight control. I explained the importance of being compliant with PAP treatment, not only for insurance purposes but primarily to improve His symptoms, and for the patient's long term health benefit, including to reduce His cardiovascular risks. I spent 25 minutes in total face-to-face time with the patient, more than 50% of which was spent in counseling and coordination of care, reviewing test results, reviewing medication and discussing or reviewing the diagnosis of OSA, its prognosis and treatment options.

## 2015-05-06 NOTE — Patient Instructions (Addendum)
Please continue using your CPAP regularly. While your insurance requires that you use CPAP at least 4 hours each night on 70% of the nights, I recommend, that you not skip any nights and use it throughout the night if you can. Getting used to CPAP and staying with the treatment long term does take time and patience and discipline. Untreated obstructive sleep apnea when it is moderate to severe can have an adverse impact on cardiovascular health and raise her risk for heart disease, arrhythmias, hypertension, congestive heart failure, stroke and diabetes. Untreated obstructive sleep apnea causes sleep disruption, nonrestorative sleep, and sleep deprivation. This can have an impact on your day to day functioning and cause daytime sleepiness and impairment of cognitive function, memory loss, mood disturbance, and problems focussing. Using CPAP regularly can improve these symptoms.  Keep up the good work! I will see you back in 6 months for sleep apnea check up, and if you continue to do well on CPAP I will see you once a year thereafter.   Please make enough sleep time and drink more water.

## 2015-08-20 ENCOUNTER — Other Ambulatory Visit: Payer: Self-pay | Admitting: Internal Medicine

## 2015-08-20 DIAGNOSIS — R222 Localized swelling, mass and lump, trunk: Secondary | ICD-10-CM

## 2015-08-23 ENCOUNTER — Ambulatory Visit
Admission: RE | Admit: 2015-08-23 | Discharge: 2015-08-23 | Disposition: A | Discharge status: Home / Self Care | Payer: 59 | Source: Ambulatory Visit | Attending: Internal Medicine | Admitting: Internal Medicine

## 2015-08-23 DIAGNOSIS — R222 Localized swelling, mass and lump, trunk: Secondary | ICD-10-CM

## 2015-08-23 MED ORDER — IOPAMIDOL (ISOVUE-300) INJECTION 61%
75.0000 mL | Freq: Once | INTRAVENOUS | Status: AC | PRN
  Administered 2015-08-23: 75 mL via INTRAVENOUS

## 2015-09-28 ENCOUNTER — Encounter: Payer: Self-pay | Admitting: Neurology

## 2015-10-21 ENCOUNTER — Ambulatory Visit: Payer: 59 | Admitting: Neurology

## 2016-10-30 IMAGING — CR DG CHEST 1V PORT
1 series · 1 of 1 positions shown · non-contrast
Comparison: None.

CLINICAL DATA: Chest palpitations beginning at [DATE] p.m. today.

EXAM:
PORTABLE CHEST - 1 VIEW

[AP]
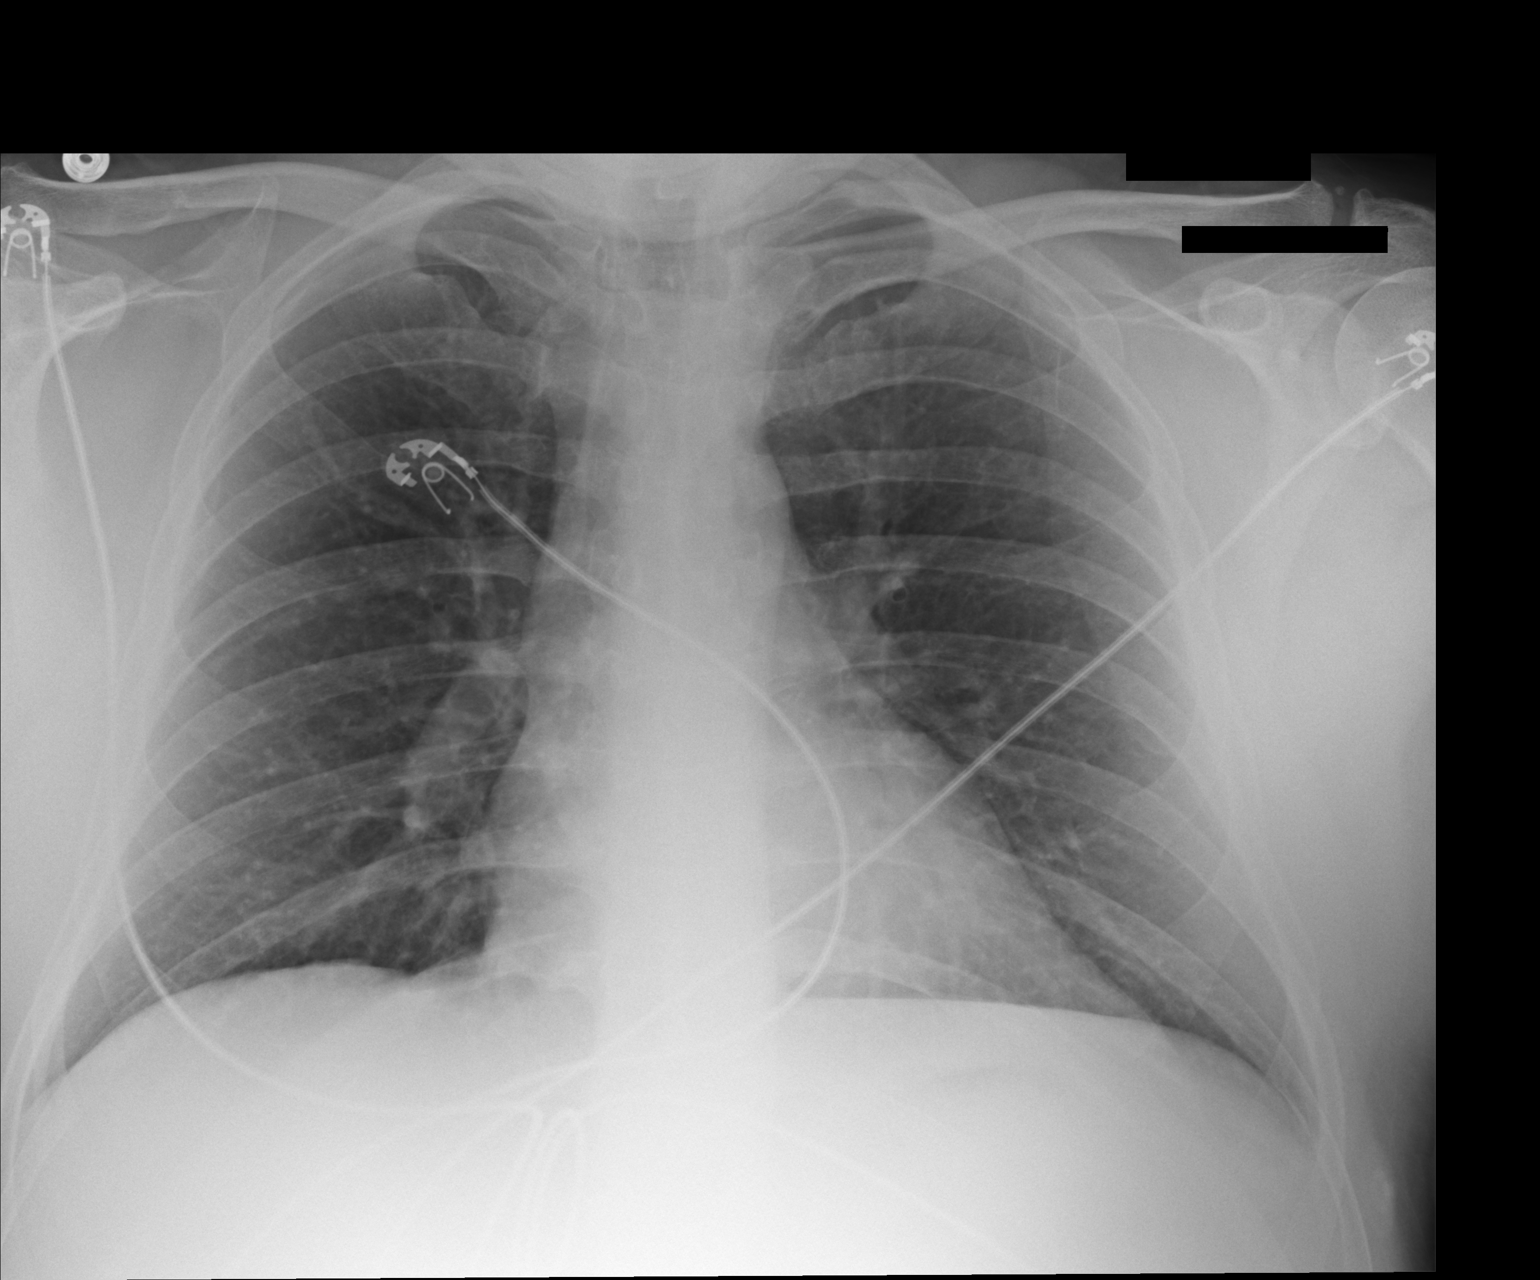

[1 of 1 positions shown; findings below may reference images not displayed]

FINDINGS: Heart size and mediastinal contours are within normal limits. Both
lungs are clear. Visualized skeletal structures are unremarkable.
IMPRESSION: Negative exam.

## 2016-11-06 DIAGNOSIS — Z0000 Encounter for general adult medical examination without abnormal findings: Secondary | ICD-10-CM | POA: Diagnosis not present

## 2016-11-16 ENCOUNTER — Other Ambulatory Visit: Payer: Self-pay | Admitting: Internal Medicine

## 2016-11-16 DIAGNOSIS — R911 Solitary pulmonary nodule: Secondary | ICD-10-CM

## 2016-11-16 DIAGNOSIS — Z1389 Encounter for screening for other disorder: Secondary | ICD-10-CM | POA: Diagnosis not present

## 2016-11-16 DIAGNOSIS — M109 Gout, unspecified: Secondary | ICD-10-CM | POA: Diagnosis not present

## 2016-11-16 DIAGNOSIS — E781 Pure hyperglyceridemia: Secondary | ICD-10-CM | POA: Diagnosis not present

## 2016-11-16 DIAGNOSIS — Z0000 Encounter for general adult medical examination without abnormal findings: Secondary | ICD-10-CM | POA: Diagnosis not present

## 2016-11-16 DIAGNOSIS — Z125 Encounter for screening for malignant neoplasm of prostate: Secondary | ICD-10-CM | POA: Diagnosis not present

## 2016-11-30 ENCOUNTER — Inpatient Hospital Stay
Admission: RE | Admit: 2016-11-30 | Discharge: 2016-11-30 | Disposition: A | Discharge status: Home / Self Care | Payer: 59 | Source: Ambulatory Visit | Attending: Internal Medicine | Admitting: Internal Medicine

## 2016-12-04 ENCOUNTER — Ambulatory Visit
Admission: RE | Admit: 2016-12-04 | Discharge: 2016-12-04 | Disposition: A | Discharge status: Home / Self Care | Payer: 59 | Source: Ambulatory Visit | Attending: Internal Medicine | Admitting: Internal Medicine

## 2016-12-04 DIAGNOSIS — R911 Solitary pulmonary nodule: Secondary | ICD-10-CM

## 2016-12-04 DIAGNOSIS — R918 Other nonspecific abnormal finding of lung field: Secondary | ICD-10-CM | POA: Diagnosis not present

## 2017-01-03 ENCOUNTER — Telehealth: Payer: Self-pay | Admitting: Neurology

## 2017-01-03 NOTE — Telephone Encounter (Signed)
It appears that pt has been scheduled in follow up for 01/08/2017.

## 2017-01-03 NOTE — Telephone Encounter (Signed)
Pt has not been seen in over one year. Last seen in 2016. Pt needs to be seen yearly to receive cpap supplies. Please call him and schedule an appt with Dr. Frances FurbishAthar or one of the NPs.

## 2017-01-03 NOTE — Telephone Encounter (Signed)
Called pt, LVM for him to call to schedule appt with Dr Frances FurbishAthar or either NP's.

## 2017-01-03 NOTE — Telephone Encounter (Signed)
Pt stated that [facility] faxed a paper over for him to be able to get new cpap supplies. Pt said [facility] said they still have not got a reply back. Pt wants to know what is going on

## 2017-01-08 ENCOUNTER — Ambulatory Visit (INDEPENDENT_AMBULATORY_CARE_PROVIDER_SITE_OTHER): Payer: 59 | Admitting: Neurology

## 2017-01-08 ENCOUNTER — Encounter: Payer: Self-pay | Admitting: Neurology

## 2017-01-08 VITALS — BP 134/84 | HR 79 | Resp 16 | Ht 71.0 in | Wt 228.0 lb

## 2017-01-08 DIAGNOSIS — G4733 Obstructive sleep apnea (adult) (pediatric): Secondary | ICD-10-CM | POA: Diagnosis not present

## 2017-01-08 DIAGNOSIS — Z9989 Dependence on other enabling machines and devices: Secondary | ICD-10-CM

## 2017-01-08 NOTE — Progress Notes (Signed)
Subjective:    Patient ID: Chad Moran is a 46 y.o. male.  HPI     Interim history:   Chad Moran is a very pleasant 46 year old right-handed gentleman with an underlying medical history of obesity, palpitations, and hypertriglyceridemia (emergency room visit on 06/20/2014 with negative cardiac workup), who presents for follow-up consultation of his obstructive sleep apnea, on treatment with CPAP. The patient is unaccompanied today and presents after a long gap of over 1-1/2 years. I last saw him on 05/06/2015, at which time he was working on his psychology degree, also working full-time as a Airline pilot. He was not fully compliant with CPAP, more suboptimal. He was planning on making some changes to his schedule to help reduce his stress level. I asked him to be fully compliant with CPAP and follow-up in 6 months.  Today, 01/08/2017: I reviewed his CPAP compliance data from 12/05/2016 through 01/03/2017 which is a total of 30 days, during which time he used his CPAP only 17 days with percent used days greater than 4 hours at 23%, indicating poor compliance with an average usage of 3 hours and 33 minutes, residual AHI of 1.7 per hour, leak on the high side with the 95th percentile at 20.9 L/m on a pressure of 9 cm, in the past 90 days his compliance percentage has been similar at 28%. He reports needing a new mask and supplies. He reports that with allergy flareup and erratic sleep schedule he does not always get enough sleep. He does not always use CPAP because of allergies. He would like to get a second humidifier unit so we can keep one at work. It is difficult for him to carry his machine back and forth. He was also recently diagnosed with low T and given hormone replacement but has not started it yet for fear of side effects.   The patient's allergies, current medications, family history, past medical history, past social history, past surgical history and problem list were reviewed and  updated as appropriate.   Previously (copied from previous notes for reference):   I saw him on 11/03/2014, at which time he reported that he was still trying to get adjusted to treatment. He was compliant with treatment. He had noticed some improvement of his sleep including improvement of morning headaches. He was trying to lose weight. He was motivated to continue with treatment.    I reviewed his CPAP compliance data from 04/04/2015 through 05/03/2015 which is a total of 30 days during which time he used his machine 26 days with percent used days greater than 4 hours of only 50%, indicating suboptimal compliance with an average usage of 3 hours and 11 minutes only, residual AHI 2.5 per hour, leak acceptable with the 95th percentile at 17.2 L/m on a pressure of 9 cm with EPR of 1.     I first met him on 07/29/2014 at the request of his primary care physician, at which time the patient reported snoring and witnessed apneas while asleep. I invited him back for sleep study. He had a split-night sleep study on 08/18/2014 and I went over his test results with him in detail today. His baseline sleep efficiency was 67.8% with a latency to sleep of 34.5 minutes and wake after sleep onset of 39 minutes with severe sleep fragmentation noted. The arousal index was 24.5 per hour, sleep distribution showed an increased percentage of stage I sleep, and a prolonged REM latency. He had mild to moderate snoring. He slept primarily  on his back. Total AHI was 36.5 per hour. Baseline oxygen saturation 92%, nadir was 86%. He was then titrated on CPAP from a pressure of 5 cm to 11 cm. The AHI was 3.6 per hour and a pressure of 9 cm. REM sleep was not achieved during the treatment portion of the study. No significant PLMS were noted during the study. Based on his test results are prescribed CPAP therapy for home use at a pressure of 9 cm.   I reviewed his CPAP compliance data from 09/30/2014 through 10/29/2014 which is a  total of 30 days during which time he used his machine every night with percent used days greater than 4 hours at 83%, indicating very good compliance with an average usage of 5 hours and 18 minutes, residual AHI acceptable at 2 per hour, leak acceptable with the 95th percentile at 17.4 L/m on a pressure of 9 cm with EPR of 2.   He is reported to snore and have witnessed apneas while asleep per wife's report. He is significantly sleepy but has a hard time sleeping or napping. He works full-time as a Airline pilot. He snores loudly at times. Often he has more than 5 awakenings at night and is not rested when he first wakes up. He has to get up for work around 6:30 and does not feel good. He has a variable bedtime. He may not fall asleep quickly at all and then may wake up 5-6 times at night. He does not drink a lot of caffeine maybe 1-2 cups per day. He does not smoke and he does not drink alcohol on a regular basis. His father snores loudly but has not been tested for OSA. His mother has a history of lung cancer, thyroid disease, and lupus as well as COPD. He went to the emergency room with dizziness, palpitations, flushing, itching of his palms and hands and nausea as well as significant diarrhea. He has had similar episodes infrequently before. He has seen cardiology some 10 years ago and was told he may have irregular heartbeat. He denies any parasomnias. His Epworth sleepiness score is 17 out of 24 today. He has woken himself up with a sense of gasping for air. He is not sure if he twitches or kicks in his sleep.   His Past Medical History Is Significant For: Past Medical History:  Diagnosis Date  . Gout   . High cholesterol   . Tachycardia     His Past Surgical History Is Significant For: Past Surgical History:  Procedure Laterality Date  . WISDOM TOOTH EXTRACTION      His Family History Is Significant For: Family History  Problem Relation Age of Onset  . Lung cancer Mother   . Hypertension  Mother   . Lupus Mother   . COPD Mother   . Arthritis Father     His Social History Is Significant For: Social History   Social History  . Marital status: Married    Spouse name: Chad Moran  . Number of children: 2  . Years of education: Associates   Occupational History  . [employer] History Main Topics  . Smoking status: Never Smoker  . Smokeless tobacco: Never Used  . Alcohol use No  . Drug use: No  . Sexual activity: Not on file   Other Topics Concern  . Not on file   Social History Narrative   Patient lives at home with his wife Chad Moran).  Education college.   Left handed.   Caffeine sweet tea three daily.    His Allergies Are:  Allergies  Allergen Reactions  . Naproxen   :   His Current Medications Are:  Outpatient Encounter Prescriptions as of 01/08/2017  Medication Sig  . aspirin 81 MG tablet Take 81 mg by mouth daily as needed for pain.  . febuxostat (ULORIC) 40 MG tablet Take 80 mg by mouth daily.  . Fenofibrate 120 MG TABS Take by mouth.  Marland Kitchen VASCEPA 1 g CAPS Take 2 capsules by mouth 2 (two) times daily.   No facility-administered encounter medications on file as of 01/08/2017.   :  Review of Systems:  Out of a complete 14 point review of systems, all are reviewed and negative with the exception of these symptoms as listed below: Review of Systems  Objective:  Neurological Exam  Physical Exam Physical Examination:   Vitals:   01/08/17 1505  BP: 134/84  Pulse: 79  Resp: 16   General Examination: The patient is a very pleasant 46 y.o. male in no acute distress. He appears well-developed and well groomed.   HEENT: Normocephalic, atraumatic, pupils are equal, round and reactive. Extraocular tracking is good without limitation to gaze excursion or nystagmus noted. Normal smooth pursuit is noted. Hearing is grossly intact. Face is symmetric with normal facial animation and normal facial sensation. Speech is  clear with no dysarthria noted. There is no hypophonia. There is no lip, neck/head, jaw or voice tremor. Neck is supple with full range of passive and active motion. Airway examination is unremarkable, other than mild to moderate airway crowding.  Chest: Clear to auscultation without wheezing, rhonchi or crackles noted.  Heart: S1+S2+0, regular and normal without murmurs, rubs or gallops noted.   Abdomen: Soft, non-tender and non-distended with normal bowel sounds appreciated on auscultation.  Extremities: There is no pitting edema in the distal lower extremities bilaterally.  Skin: Warm and dry without trophic changes noted. There are no varicose veins.  Musculoskeletal: exam reveals no obvious joint deformities, tenderness or joint swelling or erythema.   Neurologically:  Mental status: The patient is awake, alert and oriented in all 4 spheres. His immediate and remote memory, attention, language skills and fund of knowledge are appropriate. There is no evidence of aphasia, agnosia, apraxia or anomia. Speech is clear with normal prosody and enunciation. Thought process is linear. Mood is normal and affect is normal.  Cranial nerves II - XII are as described above under HEENT exam.  Motor exam: Normal bulk, strength and tone is noted. Fine motor skills and coordination: intact. Romberg is negative.  Cerebellar testing: No dysmetria or intention tremor on finger to nose testing. Heel to shin is unremarkable bilaterally. There is no truncal or gait ataxia.  Sensory exam: intact to light touch.  Gait, station and balance: Normal and tandem walk is unremarkable.  Assessment and Plan:  In summary, Chad Moran is a very pleasant 46 year old male with an underlying medical history of obesity, palpitations, and hypertriglyceridemia, who presents for follow-up consultation of his severe obstructive sleep apnea by number of events, treated with CPAP. He had a split-night sleep study on  08/18/2014 and we reviewed his most recent compliance data. He is not compliant with his CPAP at this time, in part because he does not always take it with him to work, he also needs new supplies, his sleep schedule is erratic. He is advised to be consistent with his CPAP because it can  help other symptoms including his daytime energy, low T, metabolism and weight loss endeavors. He is strongly advised to make more time for sleep. Physical exam is stable with the exception of mild weight gain. I prescribed new supplies for his CPAP including a new humidifier unit. Ultimately, he may want to get a second CPAP machine but his insurance may not cover it. I suggested a one-year checkup, sooner as needed. I answered all his questions today and he was in agreement.  I spent 20 minutes in total face-to-face time with the patient, more than 50% of which was spent in counseling and coordination of care, reviewing test results, reviewing medication and discussing or reviewing the diagnosis of OSA its prognosis and treatment options. Pertinent laboratory and imaging test results that were available during this visit with the patient were reviewed by me and considered in my medical decision making (see chart for details).

## 2017-01-08 NOTE — Patient Instructions (Addendum)
Please continue using your CPAP regularly. While your insurance requires that you use CPAP at least 4 hours each night on 70% of the nights, I recommend, that you not skip any nights and use it throughout the night if you can. Getting used to CPAP and staying with the treatment long term does take time and patience and discipline. Untreated obstructive sleep apnea when it is moderate to severe can have an adverse impact on cardiovascular health and raise her risk for heart disease, arrhythmias, hypertension, congestive heart failure, stroke and diabetes. Untreated obstructive sleep apnea causes sleep disruption, nonrestorative sleep, and sleep deprivation. This can have an impact on your day to day functioning and cause daytime sleepiness and impairment of cognitive function, memory loss, mood disturbance, and problems focussing. Using CPAP regularly can improve these symptoms.  You can try Melatonin at night for sleep: take 1 mg to 3 mg, one to 2 hours before your bedtime. You can go up to 5 mg if needed. It is over the counter and comes in pill form, chewable form and spray, if you prefer.        

## 2017-01-17 DIAGNOSIS — G4733 Obstructive sleep apnea (adult) (pediatric): Secondary | ICD-10-CM | POA: Diagnosis not present

## 2017-02-23 NOTE — Telephone Encounter (Signed)
ERROR

## 2017-12-03 DIAGNOSIS — R82998 Other abnormal findings in urine: Secondary | ICD-10-CM | POA: Diagnosis not present

## 2017-12-03 DIAGNOSIS — Z0000 Encounter for general adult medical examination without abnormal findings: Secondary | ICD-10-CM | POA: Diagnosis not present

## 2017-12-03 DIAGNOSIS — M1000 Idiopathic gout, unspecified site: Secondary | ICD-10-CM | POA: Diagnosis not present

## 2017-12-03 DIAGNOSIS — Z125 Encounter for screening for malignant neoplasm of prostate: Secondary | ICD-10-CM | POA: Diagnosis not present

## 2017-12-05 DIAGNOSIS — E781 Pure hyperglyceridemia: Secondary | ICD-10-CM | POA: Diagnosis not present

## 2017-12-05 DIAGNOSIS — Z0000 Encounter for general adult medical examination without abnormal findings: Secondary | ICD-10-CM | POA: Diagnosis not present

## 2017-12-05 DIAGNOSIS — Z1389 Encounter for screening for other disorder: Secondary | ICD-10-CM | POA: Diagnosis not present

## 2018-01-08 ENCOUNTER — Telehealth: Payer: Self-pay

## 2018-01-08 NOTE — Telephone Encounter (Signed)
Pt does not have any wireless cpap data since January of 2019. I called pt, left a detailed message on his cell phone number, per DPR, asking him to bring his cpap to his appt tomorrow and to call me back with any questions or concerns.

## 2018-01-08 NOTE — Telephone Encounter (Signed)
Pt called me back. He reports that he has hurt his back and has been seeing a chiropractor to fix his neck and back issues. The chiropractor told him that he needed to fix his neck and that would help him sleep better. Because of this,pt stopped using his cpap because he was sleeping better. I advised pt that we recommend that pts be re-tested for sleep apnea before deciding if they should stop using a cpap. If pt still has sleep apnea and is not using his cpap, his sleep apnea may be untreated. Pt decided that he would like to start using his cpap again and made an appt for 02/27/18 at 1:00pm and cancelled the appt for tomorrow.

## 2018-01-09 ENCOUNTER — Ambulatory Visit: Payer: 59 | Admitting: Neurology

## 2018-02-26 NOTE — Telephone Encounter (Signed)
I called pt. There is no data from his cpap since January. Pt reports that he has been unable to use his cpap because he has been very busy. Pt's wife has been diagnosed with breast cancer. Pt asked for his appt tomorrow to be cancelled and a new appt was made for 05/20/18 at 8:30am. Pt will start using cpap again in the meantime.

## 2018-02-27 ENCOUNTER — Ambulatory Visit: Payer: Self-pay | Admitting: Neurology

## 2018-05-14 ENCOUNTER — Encounter: Payer: Self-pay | Admitting: Neurology

## 2018-05-20 ENCOUNTER — Ambulatory Visit: Payer: 59 | Admitting: Neurology

## 2018-05-20 ENCOUNTER — Encounter: Payer: Self-pay | Admitting: Neurology

## 2018-05-20 VITALS — BP 131/87 | HR 75 | Ht 71.0 in | Wt 234.0 lb

## 2018-05-20 DIAGNOSIS — G4733 Obstructive sleep apnea (adult) (pediatric): Secondary | ICD-10-CM | POA: Diagnosis not present

## 2018-05-20 DIAGNOSIS — Z9989 Dependence on other enabling machines and devices: Secondary | ICD-10-CM | POA: Diagnosis not present

## 2018-05-20 NOTE — Patient Instructions (Addendum)
Please continue using your CPAP regularly. While your insurance requires that you use CPAP at least 4 hours each night on 70% of the nights, I recommend, that you not skip any nights and use it throughout the night if you can. Getting used to CPAP and staying with the treatment long term does take time and patience and discipline. Untreated obstructive sleep apnea when it is moderate to severe can have an adverse impact on cardiovascular health and raise her risk for heart disease, arrhythmias, hypertension, congestive heart failure, stroke and diabetes. Untreated obstructive sleep apnea causes sleep disruption, nonrestorative sleep, and sleep deprivation. This can have an impact on your day to day functioning and cause daytime sleepiness and impairment of cognitive function, memory loss, mood disturbance, and problems focussing. Using CPAP regularly can improve these symptoms.  I will write for a travel CPAP. [facility] about [facility] and how to go about using your flex account.

## 2018-05-20 NOTE — Progress Notes (Signed)
Subjective:    Patient ID: Chad Moran is a 47 y.o. male.  HPI     Interim history:   Mr. Chad Moran is a very pleasant 47 year old right-handed gentleman with an underlying medical history of obesity, palpitations, and hypertriglyceridemia (emergency room visit on 06/20/2014 with negative cardiac workup), who presents for follow-up consultation of his obstructive sleep apnea, on treatment with CPAP. The patient is unaccompanied today. I last saw him on 01/08/2017, at which time he was not fully compliant with CPAP, he had flareup of allergies needed new supplies. He was diagnosed with low testosterone in the interim.  Today, 05/20/2018: I reviewed his CPAP compliance data from 04/15/2018 through 05/14/2018 which is a total of 30 days, during which time he used his machine 29 days with percent used days greater than 4 hours at 80%, indicating very good compliance with an average usage of 5 hours and 53 minutes, residual AHI at goal at 2.4 per hour, leak on the lower end with the 95th percentile at 7.2 L/m on a pressure of 9 cm. He reports doing well with his CPAP, needs new supplies. He is also interested in getting a secondary CPAP machine. He bought a CPAP cleaning machine. He had a lapse in treatment because of a back injury but is back on CPAP currently. He has had some interim stressors what with his wife's health. They are holding up okay.   The patient's allergies, current medications, family history, past medical history, past social history, past surgical history and problem list were reviewed and updated as appropriate.    Previously (copied from previous notes for reference):   I saw him on 05/06/2015, at which time he was working on his psychology degree, also working full-time as a Airline pilot. He was not fully compliant with CPAP, more suboptimal. He was planning on making some changes to his schedule to help reduce his stress level. I asked him to be fully compliant with CPAP and  follow-up in 6 months.   I reviewed his CPAP compliance data from 12/05/2016 through 01/03/2017 which is a total of 30 days, during which time he used his CPAP only 17 days with percent used days greater than 4 hours at 23%, indicating poor compliance with an average usage of 3 hours and 33 minutes, residual AHI of 1.7 per hour, leak on the high side with the 95th percentile at 20.9 L/m on a pressure of 9 cm, in the past 90 days his compliance percentage has been similar at 28%.   I saw him on 11/03/2014, at which time he reported that he was still trying to get adjusted to treatment. He was compliant with treatment. He had noticed some improvement of his sleep including improvement of morning headaches. He was trying to lose weight. He was motivated to continue with treatment.    I reviewed his CPAP compliance data from 04/04/2015 through 05/03/2015 which is a total of 30 days during which time he used his machine 26 days with percent used days greater than 4 hours of only 50%, indicating suboptimal compliance with an average usage of 3 hours and 11 minutes only, residual AHI 2.5 per hour, leak acceptable with the 95th percentile at 17.2 L/m on a pressure of 9 cm with EPR of 1.     I first met him on 07/29/2014 at the request of his primary care physician, at which time the patient reported snoring and witnessed apneas while asleep. I invited him back for sleep study. He  had a split-night sleep study on 08/18/2014 and I went over his test results with him in detail today. His baseline sleep efficiency was 67.8% with a latency to sleep of 34.5 minutes and wake after sleep onset of 39 minutes with severe sleep fragmentation noted. The arousal index was 24.5 per hour, sleep distribution showed an increased percentage of stage I sleep, and a prolonged REM latency. He had mild to moderate snoring. He slept primarily on his back. Total AHI was 36.5 per hour. Baseline oxygen saturation 92%, nadir was 86%. He was  then titrated on CPAP from a pressure of 5 cm to 11 cm. The AHI was 3.6 per hour and a pressure of 9 cm. REM sleep was not achieved during the treatment portion of the study. No significant PLMS were noted during the study. Based on his test results are prescribed CPAP therapy for home use at a pressure of 9 cm.   I reviewed his CPAP compliance data from 09/30/2014 through 10/29/2014 which is a total of 30 days during which time he used his machine every night with percent used days greater than 4 hours at 83%, indicating very good compliance with an average usage of 5 hours and 18 minutes, residual AHI acceptable at 2 per hour, leak acceptable with the 95th percentile at 17.4 L/m on a pressure of 9 cm with EPR of 2.   He is reported to snore and have witnessed apneas while asleep per wife's report. He is significantly sleepy but has a hard time sleeping or napping. He works full-time as a Airline pilot. He snores loudly at times. Often he has more than 5 awakenings at night and is not rested when he first wakes up. He has to get up for work around 6:30 and does not feel good. He has a variable bedtime. He may not fall asleep quickly at all and then may wake up 5-6 times at night. He does not drink a lot of caffeine maybe 1-2 cups per day. He does not smoke and he does not drink alcohol on a regular basis. His father snores loudly but has not been tested for OSA. His mother has a history of lung cancer, thyroid disease, and lupus as well as COPD. He went to the emergency room with dizziness, palpitations, flushing, itching of his palms and hands and nausea as well as significant diarrhea. He has had similar episodes infrequently before. He has seen cardiology some 10 years ago and was told he may have irregular heartbeat. He denies any parasomnias. His Epworth sleepiness score is 17 out of 24 today. He has woken himself up with a sense of gasping for air. He is not sure if he twitches or kicks in his sleep.  His  Past Medical History Is Significant For: Past Medical History:  Diagnosis Date  . Gout   . High cholesterol   . Tachycardia     His Past Surgical History Is Significant For: Past Surgical History:  Procedure Laterality Date  . WISDOM TOOTH EXTRACTION      His Family History Is Significant For: Family History  Problem Relation Age of Onset  . Lung cancer Mother   . Hypertension Mother   . Lupus Mother   . COPD Mother   . Arthritis Father     His Social History Is Significant For: Social History   Socioeconomic History  . Marital status: Married    Spouse name: Anderson Malta  . Number of children: 2  . Years of education:  Associates  . Highest education level: Not on file  Occupational History  . Occupation: Doctor, hospital: [employer]    Comment: [employer]  Social Needs  . Financial resource strain: Not on file  . Food insecurity:    Worry: Not on file    Inability: Not on file  . Transportation needs:    Medical: Not on file    Non-medical: Not on file  Tobacco Use  . Smoking status: Never Smoker  . Smokeless tobacco: Never Used  Substance and Sexual Activity  . Alcohol use: No    Alcohol/week: 0.0 standard drinks  . Drug use: No  . Sexual activity: Not on file  Lifestyle  . Physical activity:    Days per week: Not on file    Minutes per session: Not on file  . Stress: Not on file  Relationships  . Social connections:    Talks on phone: Not on file    Gets together: Not on file    Attends religious service: Not on file    Active member of club or organization: Not on file    Attends meetings of clubs or organizations: Not on file    Relationship status: Not on file  Other Topics Concern  . Not on file  Social History Narrative   Patient lives at home with his wife Anderson Malta).   Education college.   Left handed.   Caffeine sweet tea three daily.    His Allergies Are:  Allergies  Allergen Reactions  . Naproxen   :   His Current  Medications Are:  Outpatient Encounter Medications as of 05/20/2018  Medication Sig  . febuxostat (ULORIC) 40 MG tablet Take 80 mg by mouth daily.  . Fenofibrate 120 MG TABS Take by mouth.  Marland Kitchen PRESCRIPTION MEDICATION Statin medication. Pt unsure of name.  Marland Kitchen VASCEPA 1 g CAPS Take 2 capsules by mouth 2 (two) times daily.  . [DISCONTINUED] aspirin 81 MG tablet Take 81 mg by mouth daily as needed for pain.   No facility-administered encounter medications on file as of 05/20/2018.   :  Review of Systems:  Out of a complete 14 point review of systems, all are reviewed and negative with the exception of these symptoms as listed below: Review of Systems  Neurological:       Pt presents today to discuss his cpap. Pt took a break from his cpap because he had a back injury but is currently using his cpap again.    Objective:  Neurological Exam  Physical Exam Physical Examination:   Vitals:   05/20/18 0830  BP: 131/87  Pulse: 75   General Examination: The patient is a very pleasant 47 y.o. male in no acute distress. He appears well-developed and well-nourished and well groomed.   HEENT:Normocephalic, atraumatic, pupils are equal, round and reactive. Extraocular tracking is good without limitation to gaze excursion or nystagmus noted. Normal smooth pursuit is noted. Hearing is grossly intact. Face is symmetric with normal facial animation and normal facial sensation. Speech is clear with no dysarthria noted. There is no hypophonia. There is no lip, neck/head, jaw or voice tremor. Neck shows FROM. Airway examination stable.   Chest:Clear to auscultation without wheezing, rhonchi or crackles noted.  Heart:S1+S2+0, regular and normal without murmurs, rubs or gallops noted.   Abdomen:Soft, non-tender and non-distended.  Extremities:There is no pitting edema in the distal lower extremities bilaterally.  Skin: Warm and dry without trophic changes noted. There are no varicose  veins.  Musculoskeletal: exam reveals no obvious joint deformities, tenderness or joint swelling or erythema.   Neurologically:  Mental status: The patient is awake, alert and oriented in all 4 spheres. His immediate and remote memory, attention, language skills and fund of knowledge are appropriate. There is no evidence of aphasia, agnosia, apraxia or anomia. Speech is clear with normal prosody and enunciation. Thought process is linear. Mood is normal and affect is normal.  Cranial nerves II - XII are as described above under HEENT exam.  Motor exam: Normal bulk, strength and tone is noted. Fine motor skills and coordination: grossly intact.   Cerebellar testing: No dysmetria or intention tremor on finger to nose testing. Heel to shin is unremarkable bilaterally. There is no truncal or gait ataxia.  Sensory exam: intact to light touch.  Gait, station and balance: Normal.   Assessment and Plan:  In summary, MILLEDGE GERDING is a very pleasant 47 year old male with an underlying medical history of obesity, palpitations, and hypertriglyceridemia, who presents for follow-up consultation of his OSA, treated with CPAP. He had a split-night sleep study on 08/18/2014 and we reviewed his most recent compliance data today. He is compliant with his CPAP at this time and commended for it. He needs new supplies. I will also write for a travel CPAP. Physical exam is stable. I suggested a one-year checkup, sooner as needed. I answered all his questions today and he was in agreement.  I spent 15 minutes in total face-to-face time with the patient, more than 50% of which was spent in counseling and coordination of care, reviewing test results, reviewing medication and discussing or reviewing the diagnosis of OSA, its prognosis and treatment options. Pertinent laboratory and imaging test results that were available during this visit with the patient were reviewed by me and considered in my medical decision making  (see chart for details).

## 2018-05-20 NOTE — Progress Notes (Signed)
Order for cpap supplies and travel cpap [facility] community [facility]. Confirmation received that the order transmitted was successful.

## 2018-07-01 DIAGNOSIS — G4733 Obstructive sleep apnea (adult) (pediatric): Secondary | ICD-10-CM | POA: Diagnosis not present

## 2019-04-16 IMAGING — CT CT CHEST W/O CM
3 of 4 series · 17 of 30 positions shown, 19 images · non-contrast
Comparison: 08/23/2015

CLINICAL DATA: 45-year-old male with right lung/pleural nodules on
2010 chest CT. Subsequent encounter.

EXAM:
CT CHEST WITHOUT CONTRAST
TECHNIQUE: Multidetector CT imaging of the chest was performed following the
standard protocol without IV contrast.

[Series 3: chest w/o · axial · non-contrast · 0.78mm/px · z∈[-246,-21]mm · 7 of 121 slices shown, 9 images]
[im 16/121  mediastinal]
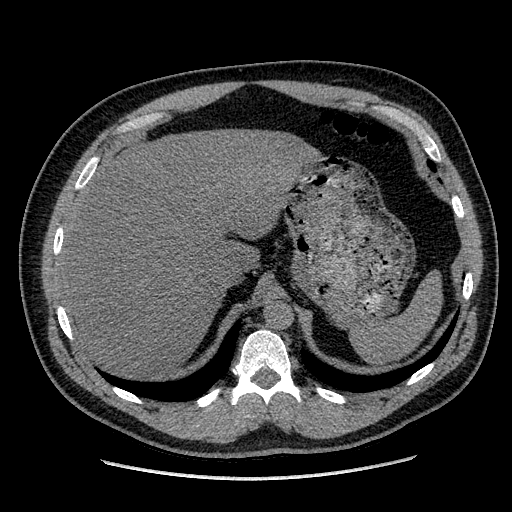
[im 16/121  lung]
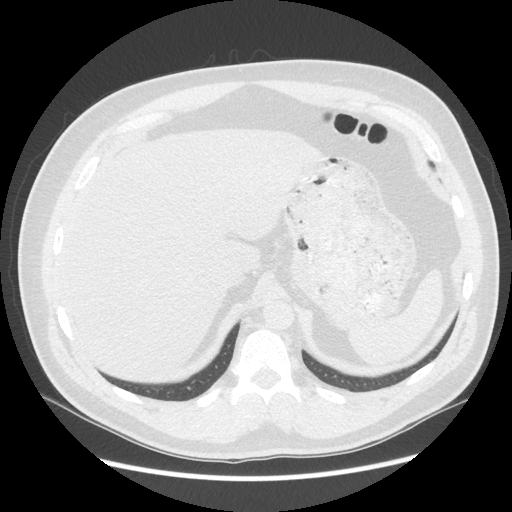
[im 31/121  lung]
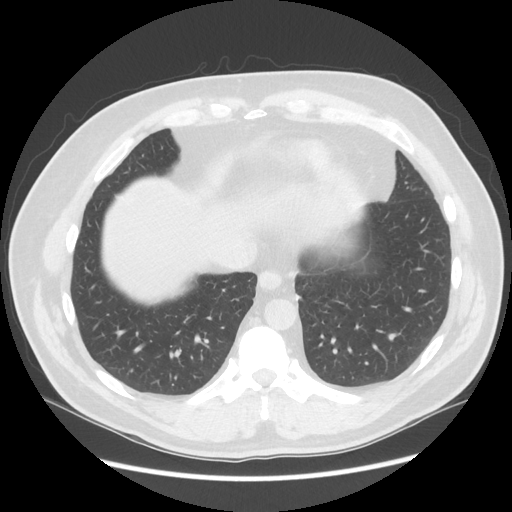
[im 46/121  lung]
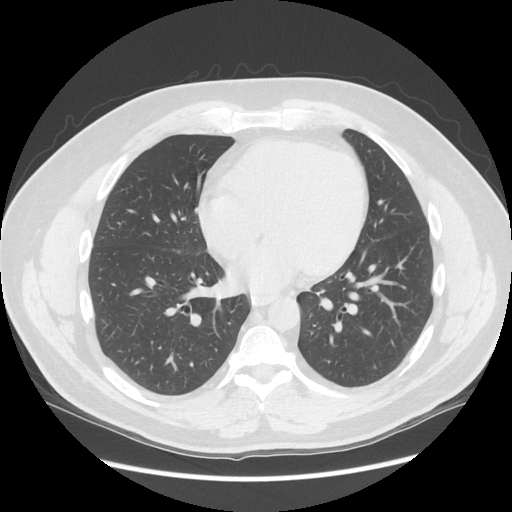
[im 61/121  lung]
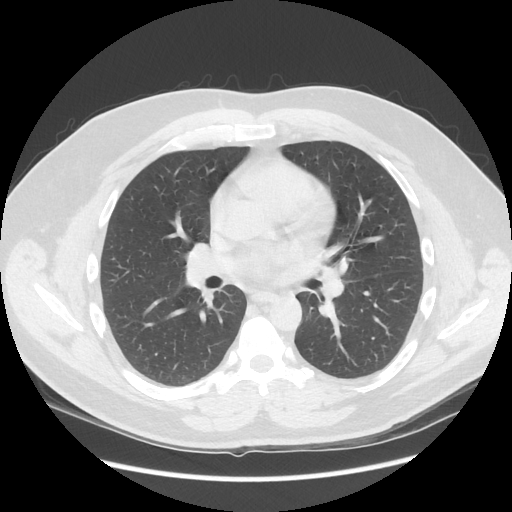
[im 76/121  mediastinal]
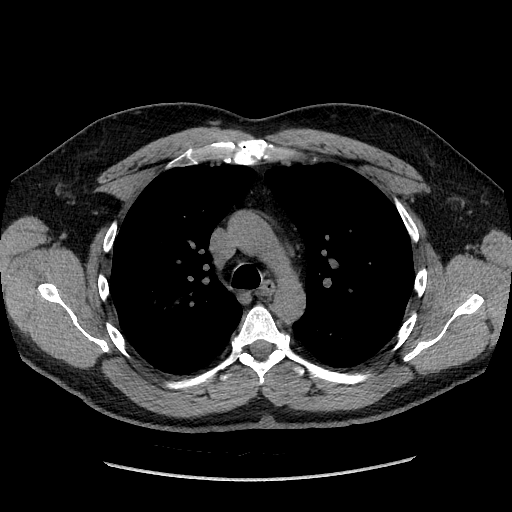
[im 76/121  lung]
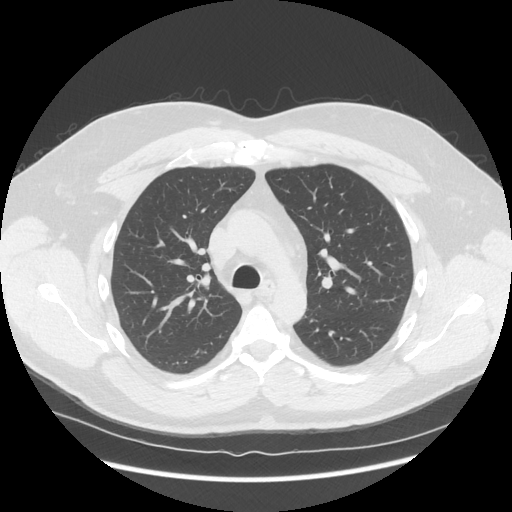
[im 91/121  lung]
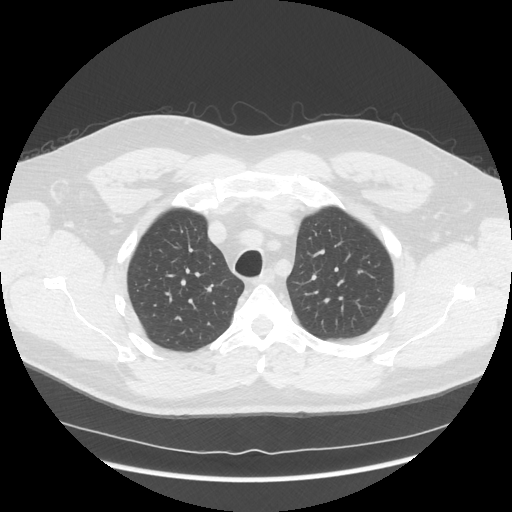
[im 106/121  lung]
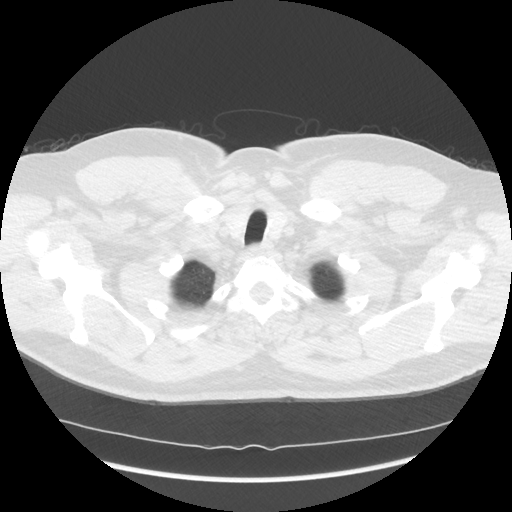

[Series 4: lung windows · axial · 0.78mm/px · z∈[-241,-28]mm · 6 of 121 slices shown]
[im 18/121  lung]
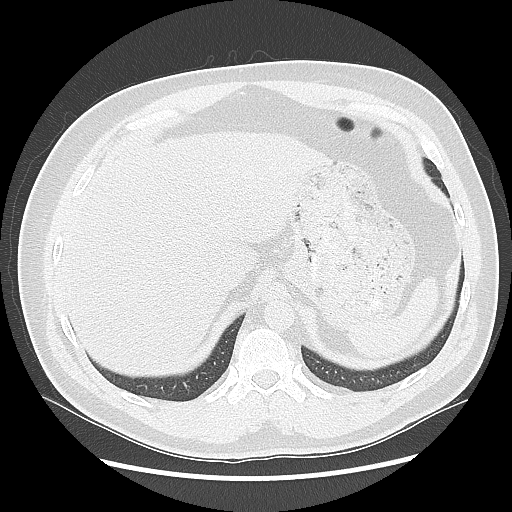
[im 35/121  lung]
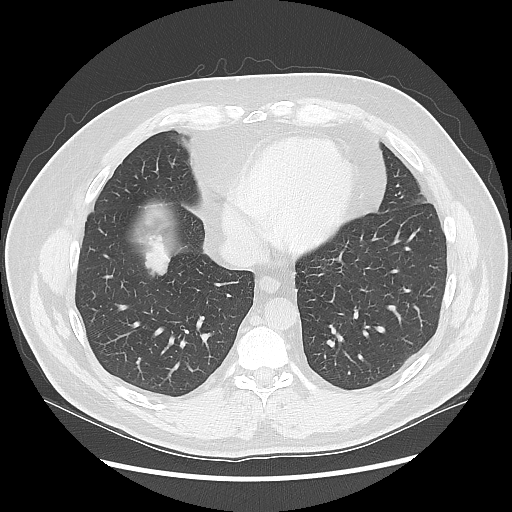
[im 52/121  lung]
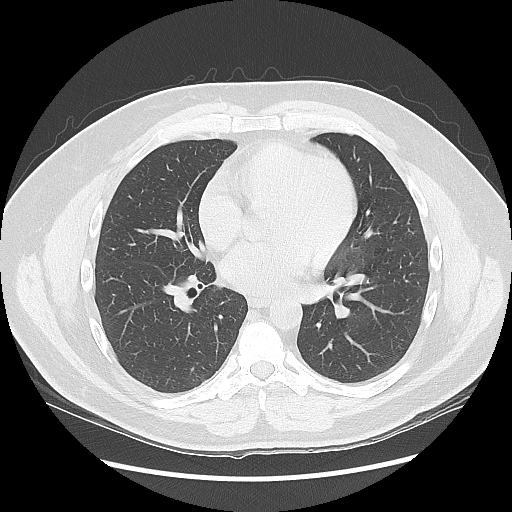
[im 69/121  lung]
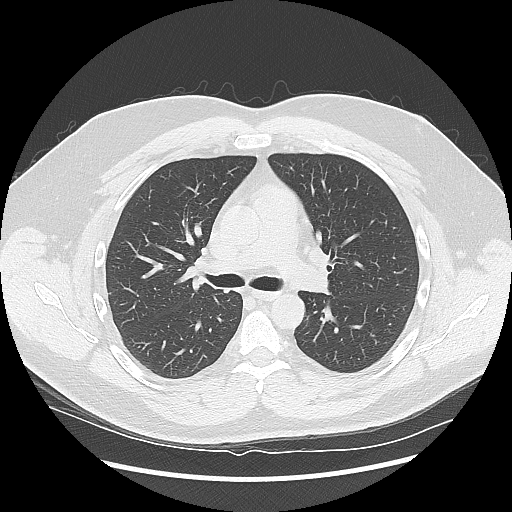
[im 86/121  lung]
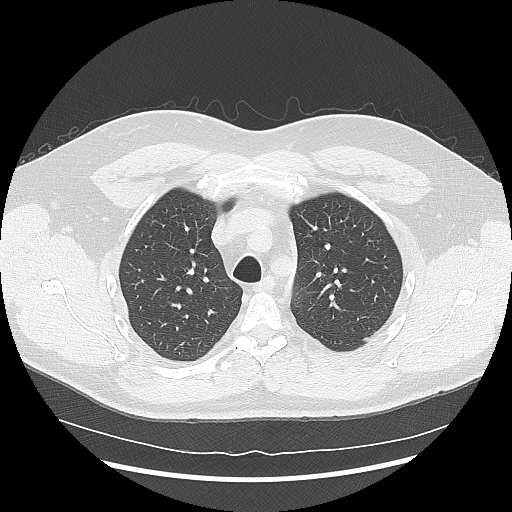
[im 103/121  lung]
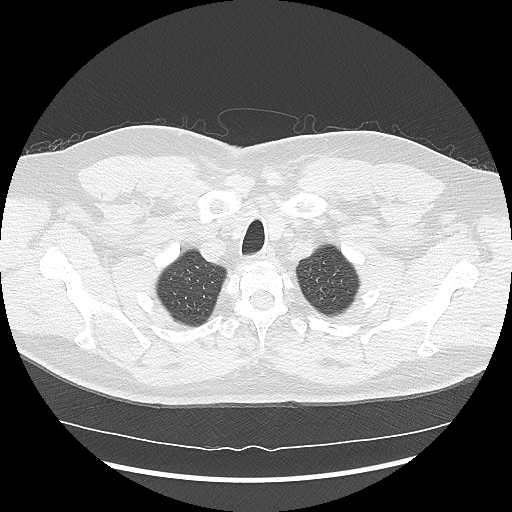

[Series 602: sagittal body · sagittal · 0.78mm/px · 4 of 161 slices shown]
[im 17/161  mediastinal]
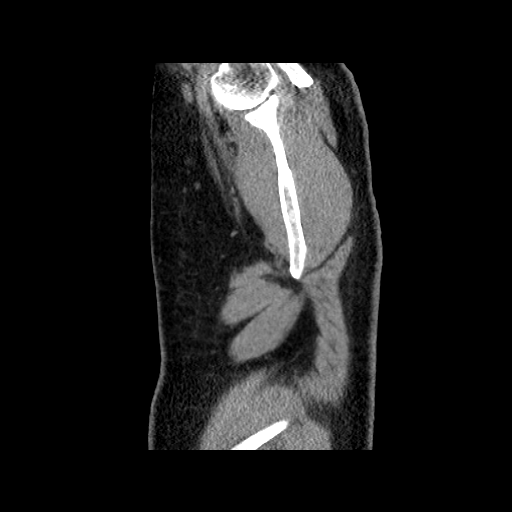
[im 33/161  mediastinal]
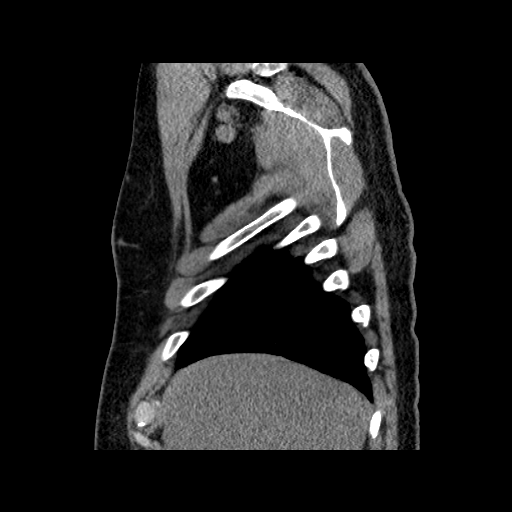
[im 49/161  mediastinal]
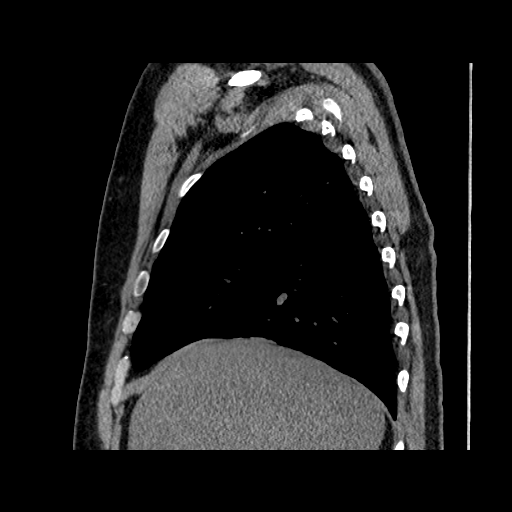
[im 65/161  mediastinal]
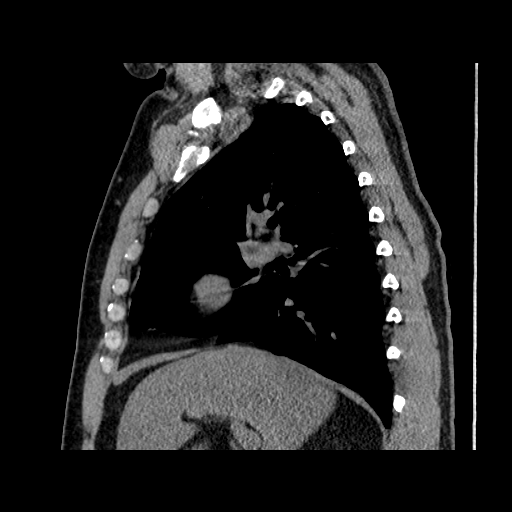

[17 of 30 positions shown; findings below may reference images not displayed]

FINDINGS: Cardiovascular: Calcified left coronary artery atherosclerosis.
Stable cardiac size. No pericardial effusion. Negative visible
noncontrast aorta.

Mediastinum/Nodes: Negative.  No lymphadenopathy.

Lungs/Pleura: Major airways are stable and normal. Stable small 2-3
mm right upper lobe lung nodule on series 4, image 38. Stable 3-4 mm
subpleural nodule in the anterior superior segment of the right
lower lobe on image 53.

Re- identified plaque-like almost 2 cm area of pleural thickening
along the dome of the right diaphragm best seen on coronal image 67
today, and series 4, image 87. This has mildly lobular contours as
before, but is unchanged in size and configuration since August 2015.

No other right lung nodule.

Stable mild pleural thickening along the left major fissure on
series 4, image 66. No other left lung nodule.

No pleural effusion.  No new pulmonary opacity.

Upper Abdomen: Chronic hepatic steatosis. Negative visualized
noncontrast spleen, adrenal glands, pancreas, and bowel in the upper
abdomen.

Musculoskeletal: No acute osseous abnormality identified.
IMPRESSION: 1. Plaque-like area of pleural thickening along the dome of the
right hemidiaphragm is unchanged since August 2015 and appears
benign. Additional follow-up is unlikely to be valuable.
2. Occasional small lung nodules and/or pleural thickening elsewhere
is stable and benign.
3. No acute findings.
4. Calcified left coronary artery atherosclerosis.
5. Hepatic steatosis.

## 2019-05-28 ENCOUNTER — Ambulatory Visit: Payer: 59 | Admitting: Neurology

## 2019-05-28 ENCOUNTER — Other Ambulatory Visit: Payer: Self-pay

## 2019-05-28 ENCOUNTER — Telehealth: Payer: Self-pay | Admitting: Neurology

## 2019-05-28 ENCOUNTER — Encounter: Payer: Self-pay | Admitting: Neurology

## 2019-05-28 VITALS — Temp 97.6°F | Ht 71.0 in | Wt 239.0 lb

## 2019-05-28 DIAGNOSIS — Z9989 Dependence on other enabling machines and devices: Secondary | ICD-10-CM

## 2019-05-28 DIAGNOSIS — G4733 Obstructive sleep apnea (adult) (pediatric): Secondary | ICD-10-CM | POA: Diagnosis not present

## 2019-05-28 NOTE — Progress Notes (Signed)
Subjective:    Patient ID: Chad Moran is a 49 y.o. male.  HPI     Interim history:  Chad Moran is a very pleasant 49 year old right-handed gentleman with an underlying medical history of obesity, palpitations, and hypertriglyceridemia (emergency room visit on 06/20/2014 with negative cardiac workup), who presents for follow-up consultation of his obstructive sleep apnea, on treatment with CPAP. The patient is unaccompanied today and presents for his yearly checkup. I last saw him on 05/20/2018, at which time he was compliant with his CPAP.  He was reporting some interim stressors and flareup of back pain.   Today, 05/28/2019: I reviewed his CPAP compliance data from 04/22/2019 through 05/21/2019 which is a total of 30 days, during which time he used his machine only 13 days with percent use days greater than 4 hours at only 27%, indicating significantly suboptimal compliance especially compared to last year, average usage of 4 hours and 47 minutes for days on treatment, residual AHI at goal at 1.7/h, leak acceptable with a 95th percentile at 9.9 L/min on a pressure of 9 cm with EPR of 1. He reports doing well with his CPAP but he has difficulty taking it back and forth to work.  He would like to have a separate machine for his workplace.  Other than that, he had a lapse in treatment because of lack of supplies, he just got new supplies.  He still benefits from treatment, he is still motivated to continue with it.  He got the Covid vaccine first dose recently had some soreness in his arm but otherwise did well with it, he has not had the second dose yet.  The patient's allergies, current medications, family history, past medical history, past social history, past surgical history and problem list were reviewed and updated as appropriate.    Previously (copied from previous notes for reference):    I saw him on 01/08/2017, at which time he was not fully compliant with CPAP, he had flareup of  allergies needed new supplies. He was diagnosed with low testosterone in the interim.   I reviewed his CPAP compliance data from 04/15/2018 through 05/14/2018 which is a total of 30 days, during which time he used his machine 29 days with percent used days greater than 4 hours at 80%, indicating very good compliance with an average usage of 5 hours and 53 minutes, residual AHI at goal at 2.4 per hour, leak on the lower end with the 95th percentile at 7.2 L/m on a pressure of 9 cm.    I saw him on 05/06/2015, at which time he was working on his psychology degree, also working full-time as a Airline pilot. He was not fully compliant with CPAP, more suboptimal. He was planning on making some changes to his schedule to help reduce his stress level. I asked him to be fully compliant with CPAP and follow-up in 6 months.   I reviewed his CPAP compliance data from 12/05/2016 through 01/03/2017 which is a total of 30 days, during which time he used his CPAP only 17 days with percent used days greater than 4 hours at 23%, indicating poor compliance with an average usage of 3 hours and 33 minutes, residual AHI of 1.7 per hour, leak on the high side with the 95th percentile at 20.9 L/m on a pressure of 9 cm, in the past 90 days his compliance percentage has been similar at 28%.    I saw him on 11/03/2014, at which time he reported that  he was still trying to get adjusted to treatment. He was compliant with treatment. He had noticed some improvement of his sleep including improvement of morning headaches. He was trying to lose weight. He was motivated to continue with treatment.    I reviewed his CPAP compliance data from 04/04/2015 through 05/03/2015 which is a total of 30 days during which time he used his machine 26 days with percent used days greater than 4 hours of only 50%, indicating suboptimal compliance with an average usage of 3 hours and 11 minutes only, residual AHI 2.5 per hour, leak acceptable with the 95th  percentile at 17.2 L/m on a pressure of 9 cm with EPR of 1.     I first met him on 07/29/2014 at the request of his primary care physician, at which time the patient reported snoring and witnessed apneas while asleep. I invited him back for sleep study. He had a split-night sleep study on 08/18/2014 and I went over his test results with him in detail today. His baseline sleep efficiency was 67.8% with a latency to sleep of 34.5 minutes and wake after sleep onset of 39 minutes with severe sleep fragmentation noted. The arousal index was 24.5 per hour, sleep distribution showed an increased percentage of stage I sleep, and a prolonged REM latency. He had mild to moderate snoring. He slept primarily on his back. Total AHI was 36.5 per hour. Baseline oxygen saturation 92%, nadir was 86%. He was then titrated on CPAP from a pressure of 5 cm to 11 cm. The AHI was 3.6 per hour and a pressure of 9 cm. REM sleep was not achieved during the treatment portion of the study. No significant PLMS were noted during the study. Based on his test results are prescribed CPAP therapy for home use at a pressure of 9 cm.   I reviewed his CPAP compliance data from 09/30/2014 through 10/29/2014 which is a total of 30 days during which time he used his machine every night with percent used days greater than 4 hours at 83%, indicating very good compliance with an average usage of 5 hours and 18 minutes, residual AHI acceptable at 2 per hour, leak acceptable with the 95th percentile at 17.4 L/m on a pressure of 9 cm with EPR of 2.   He is reported to snore and have witnessed apneas while asleep per wife's report. He is significantly sleepy but has a hard time sleeping or napping. He works full-time as a Airline pilot. He snores loudly at times. Often he has more than 5 awakenings at night and is not rested when he first wakes up. He has to get up for work around 6:30 and does not feel good. He has a variable bedtime. He may not fall  asleep quickly at all and then may wake up 5-6 times at night. He does not drink a lot of caffeine maybe 1-2 cups per day. He does not smoke and he does not drink alcohol on a regular basis. His father snores loudly but has not been tested for OSA. His mother has a history of lung cancer, thyroid disease, and lupus as well as COPD. He went to the emergency room with dizziness, palpitations, flushing, itching of his palms and hands and nausea as well as significant diarrhea. He has had similar episodes infrequently before. He has seen cardiology some 10 years ago and was told he may have irregular heartbeat. He denies any parasomnias. His Epworth sleepiness score is 17 out of 24  today. He has woken himself up with a sense of gasping for air. He is not sure if he twitches or kicks in his sleep.  His Past Medical History Is Significant For: Past Medical History:  Diagnosis Date  . Gout   . High cholesterol   . Tachycardia     His Past Surgical History Is Significant For: Past Surgical History:  Procedure Laterality Date  . WISDOM TOOTH EXTRACTION      His Family History Is Significant For: Family History  Problem Relation Age of Onset  . Lung cancer Mother   . Hypertension Mother   . Lupus Mother   . COPD Mother   . Arthritis Father     His Social History Is Significant For: Social History   Socioeconomic History  . Marital status: Married    Spouse name: Chad Moran  . Number of children: 2  . Years of education: Associates  . Highest education level: Not on file  Occupational History  . Occupation: Doctor, hospital: [employer]    Comment: [employer]  Tobacco Use  . Smoking status: Never Smoker  . Smokeless tobacco: Never Used  Substance and Sexual Activity  . Alcohol use: No    Alcohol/week: 0.0 standard drinks  . Drug use: No  . Sexual activity: Not on file  Other Topics Concern  . Not on file  Social History Narrative   Patient lives at home with his wife  Chad Moran).   Education college.   Left handed.   Caffeine sweet tea three daily.   Social Determinants of Health   Financial Resource Strain:   . Difficulty of Paying Living Expenses: Not on file  Food Insecurity:   . Worried About Charity fundraiser in the Last Year: Not on file  . Ran Out of Food in the Last Year: Not on file  Transportation Needs:   . Lack of Transportation (Medical): Not on file  . Lack of Transportation (Non-Medical): Not on file  Physical Activity:   . Days of Exercise per Week: Not on file  . Minutes of Exercise per Session: Not on file  Stress:   . Feeling of Stress : Not on file  Social Connections:   . Frequency of Communication with Friends and Family: Not on file  . Frequency of Social Gatherings with Friends and Family: Not on file  . Attends Religious Services: Not on file  . Active Member of Clubs or Organizations: Not on file  . Attends Archivist Meetings: Not on file  . Marital Status: Not on file    His Allergies Are:  Allergies  Allergen Reactions  . Naproxen   :   His Current Medications Are:  Outpatient Encounter Medications as of 05/28/2019  Medication Sig  . febuxostat (ULORIC) 40 MG tablet Take 80 mg by mouth daily.  . Fenofibrate 120 MG TABS Take by mouth.  . rosuvastatin (CRESTOR) 5 MG tablet Take 5 mg by mouth daily at 6 PM.  . VASCEPA 1 g CAPS Take 2 capsules by mouth 2 (two) times daily.  . [DISCONTINUED] PRESCRIPTION MEDICATION Statin medication. Pt unsure of name.   No facility-administered encounter medications on file as of 05/28/2019.  :  Review of Systems:  Out of a complete 14 point review of systems, all are reviewed and negative with the exception of these symptoms as listed below: Review of Systems  Constitutional: Negative.   HENT: Negative.   Eyes: Negative.   Respiratory: Negative.  Cardiovascular: Negative.   Gastrointestinal: Negative.   Endocrine: Negative.   Genitourinary: Negative.    Musculoskeletal: Negative.   Skin: Negative.   Allergic/Immunologic: Negative.   Neurological:       Epworth Sleepiness Scale 0= would never doze 1= slight chance of dozing 2= moderate chance of dozing 3= high chance of dozing  Sitting and reading:2 Watching TV:2 Sitting inactive in a public place (ex. Theater or meeting):2 As a passenger in a car for an hour without a break:0 Lying down to rest in the afternoon:3 Sitting and talking to someone:1 Sitting quietly after lunch (no alcohol):2 In a car, while stopped in traffic:0 Total:12  Hematological: Negative.   Psychiatric/Behavioral: Negative.     Objective:  Neurological Exam  Physical Exam Physical Examination:   Vitals:   05/28/19 0834  Temp: 97.6 F (36.4 C)    General Examination: The patient is a very pleasant 49 y.o. male in no acute distress. He appears well-developed and well-nourished and well groomed.   HEENT:Normocephalic, atraumatic, pupils are equal, round and reactive, extraocular tracking is preserved. Hearing is grossly intact. Face is symmetric with normal facial animation and normal facial sensation. Speech is clear with no dysarthria noted. There is no hypophonia. There is no lip, neck/head, jaw or voice tremor. Neck shows FROM. Airway examination stable.   Chest:Clear to auscultation without wheezing, rhonchi or crackles noted.  Heart:S1+S2+0, regular and normal without murmurs, rubs or gallops noted.   Abdomen:Soft, non-tender and non-distended.  Extremities:There is no pitting edema in the distal lower extremities bilaterally.  Skin: Warm and dry without trophic changes noted. There are no varicose veins.  Musculoskeletal: exam reveals no obvious joint deformities, tenderness or joint swelling or erythema.   Neurologically:  Mental status: The patient is awake, alert and oriented in all 4 spheres. His immediate and remote memory, attention, language skills and fund of knowledge  are appropriate. There is no evidence of aphasia, agnosia, apraxia or anomia. Speech is clear with normal prosody and enunciation. Thought process is linear. Mood is normal and affect is normal.  Cranial nerves II - XII are as described above under HEENT exam.  Motor exam: Normal bulk, strength and tone is noted. Fine motor skills and coordination: grossly intact.   Cerebellar testing: No dysmetria or intention tremor on finger to nose testing. Heel to shin is unremarkable bilaterally. There is no truncal or gait ataxia.  Sensory exam: intact to light touch.  Gait, station and balance: Normal.   Assessment and Plan:  In summary, Chad Moran is a very pleasant 49 year old male with an underlying medical history of obesity, palpitations, and hypertriglyceridemia, who presents for follow-up consultation of his OSA, treated with CPAP. He had a split-night sleep study on 08/18/2014 and we reviewedhis most recent compliance data today. He Has been compliant with treatment with recent labs and treatment noted, he needed new supplies.  He also would like to have a secondary CPAP machine for his workplace.  I provided a prescription for this.  He is advised to be fully compliant with his CPAP and follow-up in 1 year routinely.  I answered all his questions today and he was in agreement.

## 2019-05-28 NOTE — Patient Instructions (Signed)
It was good to see you again today. Please continue using your CPAP regularly. While your insurance requires that you use CPAP at least 4 hours each night on 70% of the nights, I recommend, that you not skip any nights and use it throughout the night if you can. Getting used to CPAP and staying with the treatment long term does take time and patience and discipline. Untreated obstructive sleep apnea when it is moderate to severe can have an adverse impact on cardiovascular health and raise her risk for heart disease, arrhythmias, hypertension, congestive heart failure, stroke and diabetes. Untreated obstructive sleep apnea causes sleep disruption, nonrestorative sleep, and sleep deprivation. This can have an impact on your day to day functioning and cause daytime sleepiness and impairment of cognitive function, memory loss, mood disturbance, and problems focussing. Using CPAP regularly can improve these symptoms.  As discussed, I will write for a second CPAP machine, you can talk to your DME company, [employer] [facility] about getting the second machine so you have 1 for your work place.  Follow-up in 1 year.

## 2019-05-28 NOTE — Telephone Encounter (Signed)
Received a email in regards to the order sent to [employer] [facility]. "I have pulled the paperwork for this patient for a replacement CPAP, however, he is not eligible for insurance to cover a new machine until after 09/08/2019 as that will be when he hits his 5 year mark.  I have talked to the patient and advised him of this and I have put him on my calendar to process this referral when he is eligible in April. "

## 2020-04-12 ENCOUNTER — Ambulatory Visit: Payer: 59 | Admitting: Orthopaedic Surgery

## 2020-04-12 ENCOUNTER — Other Ambulatory Visit: Payer: Self-pay

## 2020-04-12 ENCOUNTER — Ambulatory Visit: Payer: Self-pay

## 2020-04-12 ENCOUNTER — Encounter: Payer: Self-pay | Admitting: Orthopaedic Surgery

## 2020-04-12 DIAGNOSIS — G8929 Other chronic pain: Secondary | ICD-10-CM

## 2020-04-12 DIAGNOSIS — M25461 Effusion, right knee: Secondary | ICD-10-CM | POA: Diagnosis not present

## 2020-04-12 DIAGNOSIS — M25561 Pain in right knee: Secondary | ICD-10-CM | POA: Diagnosis not present

## 2020-04-12 NOTE — Progress Notes (Signed)
Office Visit Note   Patient: Chad Moran           Date of Birth: November 04, 1970           MRN: [id number] Visit Date: 04/12/2020              Requested by: Martha Clan, MD [address] [place],  [place] [address] PCP: Martha Clan, MD   Assessment & Plan: Visit Diagnoses:  1. Chronic pain of right knee   2. Effusion, right knee     Plan: Given that he continues to have medial symptoms for last 4 years and it seems to be slowly worsening, we will obtain a MRI of his right knee to rule out a medial meniscal tear.  He will avoid pivoting activities for now I will see him back for review of the MRI once it is done.  All questions and concerns were answered and addressed.  Follow-Up Instructions: Return in about 3 weeks (around 05/03/2020).   Orders:  Orders Placed This Encounter  Procedures  . XR Knee 1-2 Views Right   No orders of the defined types were placed in this encounter.     Procedures: No procedures performed   Clinical Data: No additional findings.   Subjective: Chief Complaint  Patient presents with  . Right Knee - Pain  The patient comes in today for evaluation treatment of medial knee pain of the right knee.  He just twisted his knee a little bit on the couch last week and got a sharp stabbing pain in the medial aspect of his knee.  This is happened 1 time before.  His knee did swell.  He says it is feeling better than what it did last week.  He denies any significant locking catching but he has had pain at the medial aspect of his right knee with pivoting activities.  He has not had surgery on this knee before.  This has happened in the past.  HPI  Review of Systems He currently denies any headache, chest pain, shortness of breath, fever, chills, nausea, vomiting  Objective: Vital Signs: There were no vitals taken for this visit.  Physical Exam He is alert and orient x3 and in no acute distress Ortho Exam Examination of his right knee today  shows only just a very slight effusion.  There is some medial joint line tenderness but not a true Lachman's or McMurray's exam.  The range of motion is full of his right knee with minimal discomfort.  Most of the pain seems to be medially.  The patella tracks well. Specialty Comments:  No specialty comments available.  Imaging: XR Knee 1-2 Views Right  Result Date: 04/12/2020 2 views of the right knee show no acute findings.    PMFS History: There are no problems to display for this patient.  Past Medical History:  Diagnosis Date  . Gout   . High cholesterol   . Tachycardia     Family History  Problem Relation Age of Onset  . Lung cancer Mother   . Hypertension Mother   . Lupus Mother   . COPD Mother   . Arthritis Father     Past Surgical History:  Procedure Laterality Date  . WISDOM TOOTH EXTRACTION     Social History   Occupational History  . Occupation: Training and development officer: [employer]    Comment: [employer]  Tobacco Use  . Smoking status: Never Smoker  . Smokeless tobacco: Never Used  Substance and Sexual Activity  . Alcohol use: No    Alcohol/week: 0.0 standard drinks  . Drug use: No  . Sexual activity: Not on file

## 2020-04-27 ENCOUNTER — Telehealth: Payer: Self-pay | Admitting: Orthopaedic Surgery

## 2020-04-27 NOTE — Telephone Encounter (Signed)
Called pt and left vm to reschedule MRI review for 12/14 with Dr. Magnus Ivan

## 2020-05-03 ENCOUNTER — Ambulatory Visit: Payer: 59 | Admitting: Orthopaedic Surgery

## 2020-05-04 ENCOUNTER — Ambulatory Visit
Admission: RE | Admit: 2020-05-04 | Discharge: 2020-05-04 | Disposition: A | Discharge status: Home / Self Care | Payer: 59 | Source: Ambulatory Visit | Attending: Orthopaedic Surgery | Admitting: Orthopaedic Surgery

## 2020-05-04 ENCOUNTER — Other Ambulatory Visit: Payer: Self-pay

## 2020-05-04 DIAGNOSIS — M25461 Effusion, right knee: Secondary | ICD-10-CM

## 2020-05-04 DIAGNOSIS — G8929 Other chronic pain: Secondary | ICD-10-CM

## 2020-05-19 ENCOUNTER — Ambulatory Visit: Payer: 59 | Admitting: Orthopaedic Surgery

## 2020-05-19 ENCOUNTER — Telehealth: Payer: Self-pay

## 2020-05-19 ENCOUNTER — Encounter: Payer: Self-pay | Admitting: Orthopaedic Surgery

## 2020-05-19 DIAGNOSIS — M1711 Unilateral primary osteoarthritis, right knee: Secondary | ICD-10-CM

## 2020-05-19 DIAGNOSIS — M23321 Other meniscus derangements, posterior horn of medial meniscus, right knee: Secondary | ICD-10-CM

## 2020-05-19 DIAGNOSIS — M25561 Pain in right knee: Secondary | ICD-10-CM

## 2020-05-19 DIAGNOSIS — G8929 Other chronic pain: Secondary | ICD-10-CM | POA: Diagnosis not present

## 2020-05-19 MED ORDER — METHYLPREDNISOLONE ACETATE 40 MG/ML IJ SUSP
40.0000 mg | INTRAMUSCULAR | Status: AC | PRN
  Administered 2020-05-19: 40 mg via INTRA_ARTICULAR

## 2020-05-19 MED ORDER — LIDOCAINE HCL 1 % IJ SOLN
3.0000 mL | INTRAMUSCULAR | Status: AC | PRN
  Administered 2020-05-19: 3 mL

## 2020-05-19 NOTE — Progress Notes (Signed)
Office Visit Note   Patient: Chad Moran           Date of Birth: 1970-07-15           MRN: [id number] Visit Date: 05/19/2020              Requested by: Martha Clan, MD [address] [place],  [place] [address] PCP: Martha Clan, MD   Assessment & Plan: Visit Diagnoses:  1. Chronic pain of right knee   2. Unilateral primary osteoarthritis, right knee   3. Other meniscus derangements, posterior horn of medial meniscus, right knee     Plan: I shared with him the MRI findings of his right knee.  I think we should be as conservative as possible now as to see.  He still works as a IT sales professional.  I have recommended a steroid injection today in his knee and then followed up in 4 weeks with hyaluronic acid to treat the osteoarthritis pain.  He is also a candidate for trying glucosamine or turmeric.  He should work on weight loss and quad strengthening exercises.  All questions and concerns were answered and addressed.  We will see him back in roughly 4 weeks to place hyaluronic acid into the right knee.  I gave him a handout about this as well.  All questions and concerns were answered and addressed.  Follow-Up Instructions: Return in about 4 weeks (around 06/16/2020).   Orders:  Orders Placed This Encounter  Procedures  . Large Joint Inj   No orders of the defined types were placed in this encounter.     Procedures: Large Joint Inj: R knee on 05/19/2020 2:11 PM Indications: diagnostic evaluation and pain Details: 22 G 1.5 in needle, superolateral approach  Arthrogram: No  Medications: 3 mL lidocaine 1 %; 40 mg methylPREDNISolone acetate 40 MG/ML Outcome: tolerated well, no immediate complications Procedure, treatment alternatives, risks and benefits explained, specific risks discussed. Consent was given by the patient. Immediately prior to procedure a time out was called to verify the correct patient, procedure, equipment, support staff and site/side marked as required.  Patient was prepped and draped in the usual sterile fashion.       Clinical Data: No additional findings.   Subjective: Chief Complaint  Patient presents with  . Right Knee - Follow-up  The patient comes in today to go over a MRI of his right knee.  He has been having locking catching of that knee that is acute but also for years worth of pain all injections in that knee.  In the medial aspect of his knee.  He also has some popping in the knee as well.  He had developed locking and catching after a twisting injury recently.  He has never had any type  HPI  Review of Systems He currently denies any headache, chest pain, shortness of breath, fever, chills, nausea, vomiting  Objective: Vital Signs: There were no vitals taken for this visit.  Physical Exam He is alert and oriented x3 and in no acute distress Ortho Exam examination of his right knee shows no significant effusion.  There is some slight varus malalignment and significant medial joint line tenderness. Specialty Comments:  No specialty comments available.  Imaging: No results found. The MRI of his right knee does show a complex posterior horn mid body medial meniscal tear.  There is also some full-thickness cartilage loss and small areas over the medial femoral condyle and medial tibial plateau as well as the patellofemoral joint.  PMFS History: Patient Active Problem List   Diagnosis Date Noted  . Unilateral primary osteoarthritis, right knee 05/19/2020   Past Medical History:  Diagnosis Date  . Gout   . High cholesterol   . Tachycardia     Family History  Problem Relation Age of Onset  . Lung cancer Mother   . Hypertension Mother   . Lupus Mother   . COPD Mother   . Arthritis Father     Past Surgical History:  Procedure Laterality Date  . WISDOM TOOTH EXTRACTION     Social History   Occupational History  . Occupation: Training and development officer: [employer]    Comment: [employer]  Tobacco Use   . Smoking status: Never Smoker  . Smokeless tobacco: Never Used  Substance and Sexual Activity  . Alcohol use: No    Alcohol/week: 0.0 standard drinks  . Drug use: No  . Sexual activity: Not on file

## 2020-05-19 NOTE — Telephone Encounter (Signed)
Right knee gel injection  

## 2020-05-19 NOTE — Telephone Encounter (Signed)
Noted will submit at the first of the year 

## 2020-05-24 ENCOUNTER — Telehealth: Payer: Self-pay

## 2020-05-24 NOTE — Telephone Encounter (Signed)
Submitted for VOB for Durolane-Right knee 

## 2020-05-25 ENCOUNTER — Encounter: Payer: Self-pay | Admitting: Neurology

## 2020-05-26 ENCOUNTER — Telehealth: Payer: Self-pay

## 2020-05-26 NOTE — Telephone Encounter (Signed)
Approved for Durolane-Right knee Buy and Bill Dr. Magnus Ivan $50 copay 10% OOP No prior auth required   Appt 06/17/20

## 2020-05-27 ENCOUNTER — Encounter: Payer: Self-pay | Admitting: Neurology

## 2020-05-27 ENCOUNTER — Ambulatory Visit: Payer: 59 | Admitting: Neurology

## 2020-05-27 VITALS — BP 130/75 | HR 69 | Ht 71.0 in | Wt 241.5 lb

## 2020-05-27 DIAGNOSIS — G4733 Obstructive sleep apnea (adult) (pediatric): Secondary | ICD-10-CM | POA: Diagnosis not present

## 2020-05-27 DIAGNOSIS — Z9989 Dependence on other enabling machines and devices: Secondary | ICD-10-CM | POA: Diagnosis not present

## 2020-05-27 NOTE — Progress Notes (Signed)
cSubjective:    Patient ID: Chad Moran is a 50 y.o. male.  HPI     Interim history:  Mr. Chad Moran is a 50 year old right-handed gentleman with an underlying medical history of obesity, palpitations, and hypertriglyceridemia, who presents for follow-up consultation of his obstructive sleep apnea, on treatment with CPAP. The patient is unaccompanied today and presents for his yearly checkup. I last saw him on 05/28/2019, at which time he was not fully compliant with his CPAP as he indicated he had trouble taking it back and forth from work.  He also had a lapse in his treatment because of lack of supplies.  He did get new supplies.  He requested a secondary machine for his workplace. I provided a prescription for a second CPAP machine.   Today, May 27, 2020: I reviewed his CPAP compliance data from the past 90 days, from 02/26/2020 through 05/25/2020, he used his machine only 34 days with percent use days greater than 4 hours at 33%.  Average usage for days on treatment of 5 hours and 25 minutes, average AHI at goal at 2.5, average leak on the low side, pressure at 9 cm with EPR of 1. He reports that he ended up buying a new machine last year.  When he uses the machine at work, it does not register on this download of course.  This would explain the gaps in the treatment.  Generally, he is compliant with treatment.  It works well for him, he uses the AirTouch medium full facemask.  The phone card often splits and then the mask starts leaking, he does not always have replacement supplies right away.  In the beginning of December he had a lapse in treatment because of supply delay.  He is motivated to continue with treatment.  He still benefits from it.  He has been vaccinated, received the moderna shot, has not received booster yet.  He is wondering about his hand tremor.  He has had it for years, it is a mild tremor but sometimes clears up, affects both hands, no family history of tremor.  He reports that  he has had his tremor since preadolescent years.  He has a history of spinal meningitis as a child.   The patient's allergies, current medications, family history, past medical history, past social history, past surgical history and problem list were reviewed and updated as appropriate.    Previously (copied from previous notes for reference):    I saw him on 05/20/2018, at which time he was compliant with his CPAP.  He was reporting some interim stressors and flareup of back pain.    I reviewed his CPAP compliance data from 04/22/2019 through 05/21/2019 which is a total of 30 days, during which time he used his machine only 13 days with percent use days greater than 4 hours at only 27%, indicating significantly suboptimal compliance especially compared to last year, average usage of 4 hours and 47 minutes for days on treatment, residual AHI at goal at 1.7/h, leak acceptable with a 95th percentile at 9.9 L/min on a pressure of 9 cm with EPR of 1.      I saw him on 01/08/2017, at which time he was not fully compliant with CPAP, he had flareup of allergies needed new supplies. He was diagnosed with low testosterone in the interim.   I reviewed his CPAP compliance data from 04/15/2018 through 05/14/2018 which is a total of 30 days, during which time he used his machine 29  days with percent used days greater than 4 hours at 80%, indicating very good compliance with an average usage of 5 hours and 53 minutes, residual AHI at goal at 2.4 per hour, leak on the lower end with the 95th percentile at 7.2 L/m on a pressure of 9 cm.    I saw him on 05/06/2015, at which time he was working on his psychology degree, also working full-time as a IT sales professional. He was not fully compliant with CPAP, more suboptimal. He was planning on making some changes to his schedule to help reduce his stress level. I asked him to be fully compliant with CPAP and follow-up in 6 months.   I reviewed his CPAP compliance data from  12/05/2016 through 01/03/2017 which is a total of 30 days, during which time he used his CPAP only 17 days with percent used days greater than 4 hours at 23%, indicating poor compliance with an average usage of 3 hours and 33 minutes, residual AHI of 1.7 per hour, leak on the high side with the 95th percentile at 20.9 L/m on a pressure of 9 cm, in the past 90 days his compliance percentage has been similar at 28%.    I saw him on 11/03/2014, at which time he reported that he was still trying to get adjusted to treatment. He was compliant with treatment. He had noticed some improvement of his sleep including improvement of morning headaches. He was trying to lose weight. He was motivated to continue with treatment.    I reviewed his CPAP compliance data from 04/04/2015 through 05/03/2015 which is a total of 30 days during which time he used his machine 26 days with percent used days greater than 4 hours of only 50%, indicating suboptimal compliance with an average usage of 3 hours and 11 minutes only, residual AHI 2.5 per hour, leak acceptable with the 95th percentile at 17.2 L/m on a pressure of 9 cm with EPR of 1.     I first met him on 07/29/2014 at the request of his primary care physician, at which time the patient reported snoring and witnessed apneas while asleep. I invited him back for sleep study. He had a split-night sleep study on 08/18/2014 and I went over his test results with him in detail today. His baseline sleep efficiency was 67.8% with a latency to sleep of 34.5 minutes and wake after sleep onset of 39 minutes with severe sleep fragmentation noted. The arousal index was 24.5 per hour, sleep distribution showed an increased percentage of stage I sleep, and a prolonged REM latency. He had mild to moderate snoring. He slept primarily on his back. Total AHI was 36.5 per hour. Baseline oxygen saturation 92%, nadir was 86%. He was then titrated on CPAP from a pressure of 5 cm to 11 cm. The AHI was  3.6 per hour and a pressure of 9 cm. REM sleep was not achieved during the treatment portion of the study. No significant PLMS were noted during the study. Based on his test results are prescribed CPAP therapy for home use at a pressure of 9 cm.   I reviewed his CPAP compliance data from 09/30/2014 through 10/29/2014 which is a total of 30 days during which time he used his machine every night with percent used days greater than 4 hours at 83%, indicating very good compliance with an average usage of 5 hours and 18 minutes, residual AHI acceptable at 2 per hour, leak acceptable with the 95th percentile at 17.4  L/m on a pressure of 9 cm with EPR of 2.   He is reported to snore and have witnessed apneas while asleep per wife's report. He is significantly sleepy but has a hard time sleeping or napping. He works full-time as a Airline pilot. He snores loudly at times. Often he has more than 5 awakenings at night and is not rested when he first wakes up. He has to get up for work around 6:30 and does not feel good. He has a variable bedtime. He may not fall asleep quickly at all and then may wake up 5-6 times at night. He does not drink a lot of caffeine maybe 1-2 cups per day. He does not smoke and he does not drink alcohol on a regular basis. His father snores loudly but has not been tested for OSA. His mother has a history of lung cancer, thyroid disease, and lupus as well as COPD. He went to the emergency room with dizziness, palpitations, flushing, itching of his palms and hands and nausea as well as significant diarrhea. He has had similar episodes infrequently before. He has seen cardiology some 10 years ago and was told he may have irregular heartbeat. He denies any parasomnias. His Epworth sleepiness score is 17 out of 24 today. He has woken himself up with a sense of gasping for air. He is not sure if he twitches or kicks in his sleep.   His Past Medical History Is Significant For: Past Medical History:   Diagnosis Date  . Gout   . High cholesterol   . OSA on CPAP   . Tachycardia     His Past Surgical History Is Significant For: Past Surgical History:  Procedure Laterality Date  . WISDOM TOOTH EXTRACTION      His Family History Is Significant For: Family History  Problem Relation Age of Onset  . Lung cancer Mother   . Hypertension Mother   . Lupus Mother   . COPD Mother   . Arthritis Father     His Social History Is Significant For: Social History   Socioeconomic History  . Marital status: Married    Spouse name: Anderson Malta  . Number of children: 2  . Years of education: Associates  . Highest education level: Not on file  Occupational History  . Occupation: Doctor, hospital: [employer]    Comment: [employer]  Tobacco Use  . Smoking status: Never Smoker  . Smokeless tobacco: Never Used  Substance and Sexual Activity  . Alcohol use: No    Alcohol/week: 0.0 standard drinks  . Drug use: No  . Sexual activity: Not on file  Other Topics Concern  . Not on file  Social History Narrative   Patient lives at home with his wife Anderson Malta).   Education college.   Left handed.   Caffeine sweet tea three daily.   Social Determinants of Health   Financial Resource Strain: Not on file  Food Insecurity: Not on file  Transportation Needs: Not on file  Physical Activity: Not on file  Stress: Not on file  Social Connections: Not on file    His Allergies Are:  Allergies  Allergen Reactions  . Naproxen   :   His Current Medications Are:  Outpatient Encounter Medications as of 05/27/2020  Medication Sig  . febuxostat (ULORIC) 40 MG tablet Take 80 mg by mouth daily.  . fenofibrate (TRICOR) 145 MG tablet Take 145 mg by mouth daily.  . Fenofibrate 120 MG TABS Take  by mouth.  . rosuvastatin (CRESTOR) 5 MG tablet Take 5 mg by mouth daily at 6 PM.  . VASCEPA 1 g CAPS Take 2 capsules by mouth 2 (two) times daily. (Patient not taking: Reported on 05/27/2020)   No  facility-administered encounter medications on file as of 05/27/2020.  :  Review of Systems:  Out of a complete 14 point review of systems, all are reviewed and negative with the exception of these symptoms as listed below: Review of Systems  Neurological:       Rm 1 one year FU OSA on CPAP, waiting on new supplies past 4 days.  Epworth Sleepiness Scale 0= would never doze 1= slight chance of dozing 2= moderate chance of dozing 3= high chance of dozing  Sitting and reading:2 Watching TV:2 Sitting inactive in a public place (ex. Theater or meeting):1 As a passenger in a car for an hour without a break:2 Lying down to rest in the afternoon:2 Sitting and talking to someone:1 Sitting quietly after lunch (no alcohol):1 In a car, while stopped in traffic:0 Total:11    Objective:  Neurological Exam  Physical Exam Physical Examination:   Vitals:   05/27/20 1029  BP: 130/75  Pulse: 69    General Examination: The patient is a very pleasant 50 y.o. male in no acute distress. He appears well-developed and well-nourished and well groomed.   HEENT:Normocephalic, atraumatic, pupils are equal, round and reactive, extraocular tracking is preserved. Hearing is grossly intact. Face is symmetric with normal facial animation and normal facial sensation. Speech is clear with no dysarthria noted. There is no hypophonia. There is no lip, neck/head, jaw or voice tremor. Neckshows FROM.Airway examinationstable.  Chest:Clear to auscultation without wheezing, rhonchi or crackles noted.  Heart:S1+S2+0, regular and normal without murmurs, rubs or gallops noted.   Abdomen:Soft, non-tender and non-distended.  Extremities:There is no pitting edema in the distal lower extremities bilaterally.  Skin: Warm and dry without trophic changes noted.   Musculoskeletal: exam reveals no obvious joint deformities, tenderness or joint swelling or erythema.   Neurologically:  Mental status: The  patient is awake, alert and oriented in all 4 spheres. His immediate and remote memory, attention, language skills and fund of knowledge are appropriate. There is no evidence of aphasia, agnosia, apraxia or anomia. Speech is clear with normal prosody and enunciation. Thought process is linear. Mood is normal and affect is normal.  Cranial nerves II - XII are as described above under HEENT exam.  Motor exam: Normal bulk, strength and tone is noted. Fine motor skills and coordination:grosslyintact.  Cerebellar testing: No dysmetria or intention tremor on finger to nose testing. Heel to shin is unremarkable bilaterally. There is no truncal or gait ataxia.  Sensory exam: intact to light touch.  Gait, station and balance: Normal, no issues.  Assessment and Plan:  In summary, RICKY GALLERY is a very pleasant 50 year old male with an underlying medical history of obesity, palpitations, and hypertriglyceridemia, who presents for follow-up consultation of hisOSA,treated with CPAP.  He has purchased a secondary CPAP machine so he can have one at work.  He uses 1 or the other machines.  Of note, he had a split-night sleep study on 08/18/2014 and we reviewedhis most recent compliance datatoday. He  has benefited from treatment and is motivated to continue.  He has had some delay in getting supplies lately.  We were able to provide a sample mask for backup. I also renewed a prescription for his supplies through his DME  company.  He is advised to be fully compliant with his CPAP and follow-up in 1 year routinely.  I answered all his questions today and he was in agreement.

## 2020-05-27 NOTE — Progress Notes (Addendum)
Community message sent to [facility]/ [facility] for new CPAP orders. Received reply that CPAP orders were received.

## 2020-05-27 NOTE — Patient Instructions (Signed)
It is always good to see you.  Please continue using your CPAP.  Numbers look good in terms of apnea control and leak from the mask.  We will try to get you a sample mask so you can have one for backup.  Please follow-up routinely in 1 year.

## 2020-06-10 ENCOUNTER — Other Ambulatory Visit: Payer: Self-pay

## 2020-06-10 DIAGNOSIS — G4733 Obstructive sleep apnea (adult) (pediatric): Secondary | ICD-10-CM

## 2020-06-10 NOTE — Progress Notes (Signed)
DME is asking for a new order for cpap. Pt is eligialbe for new machine now.   Will fwd to MD to sign off.

## 2020-06-17 ENCOUNTER — Other Ambulatory Visit: Payer: Self-pay

## 2020-06-17 ENCOUNTER — Ambulatory Visit: Payer: 59 | Admitting: Orthopaedic Surgery

## 2020-06-17 DIAGNOSIS — M1711 Unilateral primary osteoarthritis, right knee: Secondary | ICD-10-CM | POA: Diagnosis not present

## 2020-06-17 MED ORDER — SODIUM HYALURONATE 60 MG/3ML IX PRSY
60.0000 mg | PREFILLED_SYRINGE | INTRA_ARTICULAR | Status: AC | PRN
  Administered 2020-06-17: 60 mg via INTRA_ARTICULAR

## 2020-06-17 NOTE — Progress Notes (Signed)
   Procedure Note  Patient: Chad Moran             Date of Birth: 18-Jan-1971           MRN: [id number]             Visit Date: 06/17/2020  Procedures: Visit Diagnoses:  1. Unilateral primary osteoarthritis, right knee     Large Joint Inj: R knee on 06/17/2020 12:56 PM Indications: diagnostic evaluation and pain Details: 22 G 1.5 in needle, superolateral approach  Arthrogram: No  Medications: 60 mg Sodium Hyaluronate 60 MG/3ML Outcome: tolerated well, no immediate complications Procedure, treatment alternatives, risks and benefits explained, specific risks discussed. Consent was given by the patient. Immediately prior to procedure a time out was called to verify the correct patient, procedure, equipment, support staff and site/side marked as required. Patient was prepped and draped in the usual sterile fashion.    The patient comes in today for hyaluronic acid injection in his right knee to treat the pain from osteoarthritis.  He is fully aware of what we hope this injection will do for him.  He is on 50 years old.  All of his pain seems to be along the medial joint line.  He has tried other conservative treatment measures including steroid injections.  I described the risk and benefits of this type of injection and then placed Durolane into his right knee without difficulty.  He was able to ambulate after this.  All questions and concerns were answered and addressed.  Follow-up can be as needed.

## 2021-05-24 ENCOUNTER — Telehealth: Payer: Self-pay

## 2021-05-24 NOTE — Telephone Encounter (Signed)
LMOM for patient that I put him in Monday at 9:15 and if he couldn't make that to call back

## 2021-05-24 NOTE — Telephone Encounter (Signed)
Pt would like to know if Dr. Magnus Ivan has anything available sooner than the 16th if he can be added to the wait list, he would like to discuss surgery.

## 2021-05-26 ENCOUNTER — Ambulatory Visit: Payer: 59 | Admitting: Neurology

## 2021-05-26 ENCOUNTER — Ambulatory Visit: Payer: 59 | Admitting: Physician Assistant

## 2021-05-26 ENCOUNTER — Other Ambulatory Visit: Payer: Self-pay

## 2021-05-26 ENCOUNTER — Encounter: Payer: Self-pay | Admitting: Physician Assistant

## 2021-05-26 ENCOUNTER — Ambulatory Visit: Payer: Self-pay

## 2021-05-26 DIAGNOSIS — G8929 Other chronic pain: Secondary | ICD-10-CM

## 2021-05-26 DIAGNOSIS — M25561 Pain in right knee: Secondary | ICD-10-CM | POA: Diagnosis not present

## 2021-05-26 DIAGNOSIS — M1711 Unilateral primary osteoarthritis, right knee: Secondary | ICD-10-CM | POA: Diagnosis not present

## 2021-05-26 MED ORDER — METHYLPREDNISOLONE ACETATE 40 MG/ML IJ SUSP
40.0000 mg | INTRAMUSCULAR | Status: AC | PRN
  Administered 2021-05-26: 40 mg via INTRA_ARTICULAR

## 2021-05-26 MED ORDER — LIDOCAINE HCL 1 % IJ SOLN
3.0000 mL | INTRAMUSCULAR | Status: AC | PRN
  Administered 2021-05-26: 3 mL

## 2021-05-26 NOTE — Progress Notes (Signed)
Office Visit Note   Patient: Chad Moran           Date of Birth: 1971/05/17           MRN: [id number] Visit Date: 05/26/2021              Requested by: Cleatis Polka., MD [address] [place],  [place] [address] PCP: Cleatis Polka., MD   Assessment & Plan: Visit Diagnoses:  1. Chronic pain of right knee   2. Primary osteoarthritis of right knee     Plan:  We will see him back in just 2 weeks to see what type of response he had to the cortisone injection.  He may benefit from repeat supplemental injection.  Dr. Magnus Ivan and I discussed with him treatment options conservative versus surgical intervention.  He is really would like to stay away from any type of surgical and intervention.  And the fact was discussed with him that knee arthroscopy with partial meniscectomy could in fact cause his arthritis to progress at a faster rate.  Questions were encouraged and answered at length.  Follow-Up Instructions: Return in about 2 weeks (around 06/09/2021).   Orders:  Orders Placed This Encounter  Procedures   Large Joint Inj   XR Knee 1-2 Views Right   No orders of the defined types were placed in this encounter.     Procedures: Large Joint Inj on 05/26/2021 5:14 PM Indications: pain Details: 22 G 1.5 in needle, anterolateral approach  Arthrogram: No  Medications: 3 mL lidocaine 1 %; 40 mg methylPREDNISolone acetate 40 MG/ML Outcome: tolerated well, no immediate complications Procedure, treatment alternatives, risks and benefits explained, specific risks discussed. Consent was given by the patient. Immediately prior to procedure a time out was called to verify the correct patient, procedure, equipment, support staff and site/side marked as required. Patient was prepped and draped in the usual sterile fashion.      Clinical Data: No additional findings.   Subjective: Chief Complaint  Patient presents with   Right Knee - Pain    HPI Mr. Chad Moran  51 year old male returns today for right knee pain.  He has been dealing with right knee pain for years.  Underwent an MRI of his knee in December 2021 which showed tricompartmental arthritis.  Patellofemoral joint with small full-thickness cartilage defects and underlying subchondral cystic changes.  Medial compartment with advanced arthritic changes and areas of full-thickness cartilage loss.  Lateral compartment with mild degenerative changes.  Medial meniscus inferior articular surface flap type tear involving the posterior horn and mid body. He did undergo a cortisone injection and supplemental injections which did help some but did not take away all of his pain.  He states he is unable to run due to the pain in his knee.  He is a IT sales professional.  He notes some swelling of the knee.  Knee feels weak and does have occasional painful popping but no locking or catching.  Patient is nondiabetic. Review of Systems No fevers chills or recent vaccines.  Objective: Vital Signs: There were no vitals taken for this visit.  Physical Exam Constitutional:      Appearance: He is not ill-appearing or diaphoretic.  Pulmonary:     Effort: Pulmonary effort is normal.  Neurological:     Mental Status: He is alert and oriented to person, place, and time.  Psychiatric:        Mood and Affect: Mood normal.    Ortho Exam Right  knee no abnormal warmth erythema or effusion no instability valgus varus stressing.  Tenderness along medial joint line.  McMurray's is negative. Specialty Comments:  No specialty comments available.  Imaging: XR Knee 1-2 Views Right  Result Date: 05/26/2021 Right knee 2 views: No acute fractures no acute findings.  Slight progression of medial compartmental arthritis consistent with moderate narrowing.  Moderate patellofemoral changes.  Lateral compartment with mild changes.    PMFS History: Patient Active Problem List   Diagnosis Date Noted   Unilateral primary osteoarthritis,  right knee 05/19/2020   Past Medical History:  Diagnosis Date   Gout    High cholesterol    OSA on CPAP    Tachycardia     Family History  Problem Relation Age of Onset   Lung cancer Mother    Hypertension Mother    Lupus Mother    COPD Mother    Arthritis Father     Past Surgical History:  Procedure Laterality Date   WISDOM TOOTH EXTRACTION     Social History   Occupational History   Occupation: Training and development officer: [employer]    Comment: [employer]  Tobacco Use   Smoking status: Never   Smokeless tobacco: Never  Substance and Sexual Activity   Alcohol use: No    Alcohol/week: 0.0 standard drinks   Drug use: No   Sexual activity: Not on file

## 2021-05-30 ENCOUNTER — Ambulatory Visit: Payer: 59 | Admitting: Orthopaedic Surgery

## 2021-06-16 ENCOUNTER — Ambulatory Visit: Payer: 59 | Admitting: Physician Assistant

## 2021-06-22 ENCOUNTER — Other Ambulatory Visit: Payer: Self-pay

## 2021-06-22 ENCOUNTER — Encounter: Payer: Self-pay | Admitting: Physician Assistant

## 2021-06-22 ENCOUNTER — Ambulatory Visit: Payer: 59 | Admitting: Physician Assistant

## 2021-06-22 DIAGNOSIS — M1711 Unilateral primary osteoarthritis, right knee: Secondary | ICD-10-CM

## 2021-06-22 NOTE — Progress Notes (Signed)
Office Visit Note   Patient: Chad Moran           Date of Birth: 08/29/1970           MRN: [id number] Visit Date: 06/22/2021              Requested by: Cleatis Polka., MD [address] [place],  [place] [address] PCP: Cleatis Polka., MD   Assessment & Plan: Visit Diagnoses:  1. Primary osteoarthritis of right knee     Plan:  Discussed with patient that has tricompartmental arthrosis progressively advancing on radiographs.  He has failed conservative treatment.  Would not recommend that 1 he has no mechanical symptoms and second he has significant tricompartmental arthritis with full-thickness cartilage loss involving the medial compartment with small areas of full-thickness cartilage loss involving the patellofemoral compartment.  Discussed with him how a knee arthroscopy actually could exacerbate the arthritic symptoms and actual arthritis due to the fact that removing part of the meniscus which acts as a shock as a root between the femur and tibia.  Recommend right total knee arthroplasty when his symptoms are such that they are affecting his daily life to the point that he cannot perform activities he wants to. Risk benefits of total knee replacements discussed with patient at length by Dr. And myself.  Risk include but are not limited to DVT/PE, wound healing problems, infection, blood loss, prolonged pain worsening pain.  Discussed with the surgical procedure and the postoperative protocol.  He will let us know if he would like to proceed with knee replacement in the near future.  Follow-Up Instructions: Return if symptoms worsen or fail to improve.   Orders:  No orders of the defined types were placed in this encounter.  No orders of the defined types were placed in this encounter.     Procedures: No procedures performed   Clinical Data: No additional findings.   Subjective: Chief Complaint  Patient presents with   Right Knee - Pain    HPI Mr.  Chad Moran returns today due to right knee pain.  He was given a cortisone injection 2 weeks ago and stated that this did not help.  He also had a new injury where he had a fall couple days while at work.  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  He is even having pain in the right knee when sleeping.  Feels his knee pain overall is progressing becoming worse.  Again he someone has tried cortisone injections and supplemental injections without any real relief.  MRI of the knee December 21 showed tricompartmental arthritis.  He did have a medial meniscal tear with a laparoscopic tear of the posterior horn and mid body.  However he denies any mechanical symptoms of the knee.  Review of Systems Denies any fevers, chills, chest pain, shortness of breath or ongoing infection.  Objective: Vital Signs: There were no vitals taken for this visit.  Physical Exam Constitutional:      Appearance: He is not ill-appearing or diaphoretic.  Pulmonary:     Effort: Pulmonary effort is normal.  Neurological:     Mental Status: He is alert and oriented to person, place, and time.  Psychiatric:        Mood and Affect: Mood normal.    Ortho Exam Knee no abnormal warmth erythema or effusion.  No instability valgus varus stressing.  Tenderness along medial joint line. Specialty Comments:  No specialty comments available.  Imaging: No results found.   PMFS History: Patient Active Problem List   Diagnosis Date Noted   Unilateral primary osteoarthritis, right knee 05/19/2020   Past Medical History:  Diagnosis Date   Gout    High cholesterol    OSA on CPAP    Tachycardia     Family History  Problem Relation Age of Onset   Lung cancer Mother    Hypertension Mother    Lupus Mother    COPD Mother    Arthritis Father     Past Surgical History:  Procedure Laterality Date   WISDOM TOOTH EXTRACTION     Social History   Occupational History   Occupation: Heritage manager: [employer]    Comment: [employer]  Tobacco Use   Smoking status: Never   Smokeless tobacco: Never  Substance and Sexual Activity   Alcohol use: No    Alcohol/week: 0.0 standard drinks   Drug use: No   Sexual activity: Not on file

## 2021-06-23 ENCOUNTER — Telehealth: Payer: Self-pay | Admitting: Orthopaedic Surgery

## 2021-06-23 NOTE — Telephone Encounter (Signed)
Received vm from pts wife stating pt is going for 2nd opinion and needs records faxed to [name]. IC patient and advised him that we need signed authorization before we can send his records. I emailed the form to him [email or web address]. pts ph [phone]

## 2021-07-29 ENCOUNTER — Telehealth: Payer: Self-pay | Admitting: Orthopaedic Surgery

## 2021-07-29 NOTE — Telephone Encounter (Signed)
Received call from patient. He would like records sent to [name]. I advised pt that we spoke on 06/23/21 and I emailed him the authorization form needed to process hts request. He asked that I email again, which I did. Once I receive his Chad Moran, I will process his [phone] ?

## 2021-08-22 ENCOUNTER — Ambulatory Visit: Payer: 59 | Admitting: Physician Assistant

## 2021-11-04 ENCOUNTER — Encounter (HOSPITAL_BASED_OUTPATIENT_CLINIC_OR_DEPARTMENT_OTHER): Payer: Self-pay

## 2021-11-04 ENCOUNTER — Emergency Department (HOSPITAL_BASED_OUTPATIENT_CLINIC_OR_DEPARTMENT_OTHER)
Admission: EM | Admit: 2021-11-04 | Discharge: 2021-11-04 | Disposition: A | Discharge status: Home / Self Care | Payer: 59 | Attending: Emergency Medicine | Admitting: Emergency Medicine

## 2021-11-04 ENCOUNTER — Other Ambulatory Visit: Payer: Self-pay

## 2021-11-04 DIAGNOSIS — I10 Essential (primary) hypertension: Secondary | ICD-10-CM | POA: Diagnosis present

## 2021-11-04 LAB — CBC
HCT: 41.7 % (ref 39.0–52.0)
Hemoglobin: 14.3 g/dL (ref 13.0–17.0)
MCH: 28.4 pg (ref 26.0–34.0)
MCHC: 34.3 g/dL (ref 30.0–36.0)
MCV: 82.7 fL (ref 80.0–100.0)
Platelets: 167 10*3/uL (ref 150–400)
RBC: 5.04 MIL/uL (ref 4.22–5.81)
RDW: 13.4 % (ref 11.5–15.5)
WBC: 10.7 10*3/uL — ABNORMAL HIGH (ref 4.0–10.5)
nRBC: 0 % (ref 0.0–0.2)

## 2021-11-04 LAB — BASIC METABOLIC PANEL WITH GFR
Anion gap: 8 (ref 5–15)
BUN: 11 mg/dL (ref 6–20)
CO2: 29 mmol/L (ref 22–32)
Calcium: 9.4 mg/dL (ref 8.9–10.3)
Chloride: 97 mmol/L — ABNORMAL LOW (ref 98–111)
Creatinine, Ser: 0.97 mg/dL (ref 0.61–1.24)
GFR, Estimated: >60 mL/min
Glucose, Bld: 124 mg/dL — ABNORMAL HIGH (ref 70–99)
Potassium: 3.8 mmol/L (ref 3.5–5.1)
Sodium: 134 mmol/L — ABNORMAL LOW (ref 135–145)

## 2021-11-04 NOTE — ED Provider Notes (Signed)
[facility] EMERGENCY DEPT Provider Note   CSN: [id number] Arrival date & time: 11/04/21  1146     History  Chief Complaint  Patient presents with   Hypertension    Chad Moran is a 51 y.o. male.  HPI  51 year old male status post right partial knee replacement on Tuesday presents today due to reports of high blood pressure.  He monitors his blood pressure at home and he noted his blood pressure to be 168/108.  He went to PT today and they would not let him participate because his blood pressure was 146/107.  Patient not a history of high blood pressure.  He does have a history of high triglycerides.  His mother has recently had a stroke.  He has had some headache when his blood pressure has been up and is currently not having any headache.  He is not any blood thinners.  Denies any chest pain or dyspnea.  However he states when he falls asleep he stops breathing.  His wife states that he is on a BiPAP machine but has not been using it for the past year.    Home Medications Prior to Admission medications   Medication Sig Start Date End Date Taking? Authorizing Provider  febuxostat (ULORIC) 40 MG tablet Take 80 mg by mouth daily.    [provider]  fenofibrate (TRICOR) 145 MG tablet Take 145 mg by mouth daily. 01/05/20   [provider]  Fenofibrate 120 MG TABS Take by mouth.    [provider]  rosuvastatin (CRESTOR) 5 MG tablet Take 5 mg by mouth daily at 6 PM.    [provider]  VASCEPA 1 g CAPS Take 2 capsules by mouth 2 (two) times daily. Patient not taking: Reported on 05/27/2020 12/14/16   [provider]      Allergies    Naproxen    Review of Systems   Review of Systems  Physical Exam Updated Vital Signs BP (!) 142/98   Pulse 84   Temp 98.1 F (36.7 C) (Oral)   Resp 14   Ht 1.803 m (5\' 11" )   Wt 113.4 kg   SpO2 94%   BMI 34.87 kg/m  Physical Exam Vitals and nursing note reviewed.  Constitutional:       General: He is not in acute distress.    Appearance: Normal appearance. He is obese.  HENT:     Head: Normocephalic.     Right Ear: External ear normal.     Nose: Nose normal.     Mouth/Throat:     Pharynx: Oropharynx is clear.  Eyes:     Pupils: Pupils are equal, round, and reactive to light.  Cardiovascular:     Rate and Rhythm: Normal rate and regular rhythm.     Pulses: Normal pulses.  Pulmonary:     Effort: Pulmonary effort is normal.  Abdominal:     General: Abdomen is flat. Bowel sounds are normal.     Palpations: Abdomen is soft.  Musculoskeletal:     Cervical back: Normal range of motion.     Comments: Bulky dressing removed from right knee but incisional dressing remains in place. There is no evidence of infection but there is some diffuse discoloration swelling consistent with his recent surgery  Skin:    General: Skin is warm and dry.     Capillary Refill: Capillary refill takes less than 2 seconds.  Neurological:     General: No focal deficit present.  Mental Status: He is alert.  Psychiatric:        Mood and Affect: Mood normal.     ED Results / Procedures / Treatments   Labs (all labs ordered are listed, but only abnormal results are displayed) Labs Reviewed  CBC - Abnormal; Notable for the following components:      Result Value   WBC 10.7 (*)    All other components within normal limits  BASIC METABOLIC PANEL - Abnormal; Notable for the following components:   Sodium 134 (*)    Chloride 97 (*)    Glucose, Bld 124 (*)    All other components within normal limits    EKG None  Radiology No results found.  Procedures Procedures    Medications Ordered in ED Medications - No data to display  ED Course/ Medical Decision Making/ A&P                           Medical Decision Making 51 year old male presents today recent postop from knee surgery with reports of hypertension.  Blood pressures here range from 1 27-1 50  systolically Renal function is normal Suspect hypertension related to patient's recent surgery, pain, exposure to medical situations.  His leg block has worn off within past 36 hours .  He is taking pain medicine and is not currently having severe pain.  He does not have any evidence of endorgan dysfunction and is not having a headache here.  I discussed about have concerns about initiating any drug therapy and subsequent hypotension.  We have discussed that blood pressure should be monitored and taken with patient is in a comfortable situation and is having minimal pain.  His wife will document this over the next week and follow-up with their primary care physician.  We discussed return parameters of systolic blood pressure greater than 200 while he is resting comfortably and with any associated symptoms of headache, chest pain, or dyspnea.  Amount and/or Complexity of Data Reviewed Labs: ordered. Decision-making details documented in ED Course. Radiology: ordered.           Final Clinical Impression(s) / ED Diagnoses Final diagnoses:  Hypertension, unspecified type    Rx / DC Orders ED Discharge Orders     None         Margarita Grizzle, MD 11/04/21 1530

## 2021-11-04 NOTE — Discharge Instructions (Signed)
Your blood pressure has been running somewhat high.  This may be due to multiple factors. Please take your blood pressure when you are in a comfortable position at a regular time at home over the next week and keep a record. Call your primary care doctor and discuss these readings with them Return to the emergency department if you are having blood pressures over 200 while you are resting and comfortable or Theus are associated with a severe headache, chest pain, or shortness of breath. Please follow-up with your orthopedist as planned Please resume use of your CPAP machine

## 2021-11-04 NOTE — ED Notes (Signed)
Pt given a sprite and is tolerating well.

## 2021-11-04 NOTE — ED Triage Notes (Addendum)
Pt is three days post op from R partial knee replacement. Pt was monitoring his BP at home and noted his BP to be 168/108. Pt was unable to participate in PT today due to his BP being 146/107 and would like to be evaluated. Pt previously had headache, but no complaint of such at this time. Denies CP, SOB.

## 2021-11-28 ENCOUNTER — Ambulatory Visit: Payer: 59 | Admitting: Cardiology

## 2021-11-30 ENCOUNTER — Inpatient Hospital Stay: Payer: 59

## 2021-11-30 ENCOUNTER — Encounter: Payer: Self-pay | Admitting: Cardiology

## 2021-11-30 ENCOUNTER — Ambulatory Visit: Payer: 59 | Admitting: Cardiology

## 2021-11-30 VITALS — BP 132/96 | HR 98 | Temp 98.0°F | Resp 16 | Ht 71.0 in | Wt 243.0 lb

## 2021-11-30 DIAGNOSIS — E782 Mixed hyperlipidemia: Secondary | ICD-10-CM | POA: Insufficient documentation

## 2021-11-30 DIAGNOSIS — R000 Tachycardia, unspecified: Secondary | ICD-10-CM

## 2021-11-30 DIAGNOSIS — I2510 Atherosclerotic heart disease of native coronary artery without angina pectoris: Secondary | ICD-10-CM | POA: Insufficient documentation

## 2021-11-30 DIAGNOSIS — I10 Essential (primary) hypertension: Secondary | ICD-10-CM | POA: Insufficient documentation

## 2021-11-30 MED ORDER — VASCEPA 1 G PO CAPS: 2.0000 g | ORAL_CAPSULE | Freq: Two times a day (BID) | ORAL | 2 refills | Status: DC

## 2021-11-30 MED ORDER — ROSUVASTATIN CALCIUM 20 MG PO TABS: 20.0000 mg | ORAL_TABLET | Freq: Every day | ORAL | 3 refills | Status: DC

## 2021-11-30 NOTE — Progress Notes (Signed)
Patient referred by Cleatis Polka., MD for tachycardia  Subjective:   Chad Moran, male    DOB: May 22, 1971, 51 y.o.   MRN: [id number]   Chief Complaint  Patient presents with   Tachycardia   New Patient (Initial Visit)     HPI  51 y.o. Caucasian male with hypertension, mixed hyperlipidemia, triglyceridemia, coronary atherosclerosis, OSA on CPAP, referred for evaluation of tachycardia.  Patient is a IT sales professional.  He recently underwent right knee surgery, and is recovering from the same.  Prior to surgery, he had strep throat.  Around that time, he noticed his blood pressure and heart rate were running higher than usual.  He denies any chest pain or shortness of breath.  He had an ER visit sometime back, with elevated blood pressure, which is since improved after being on olmesartan 20 mg daily.  Patient reports that he is not compliant with his lipid-lowering medications.  He has recently gained 50 pounds of weight through his relatively reduced physical activity due to his knee pain.  He had a CT chest few years ago, that showed coronary atherosclerosis.  He also underwent exercise treadmill stress test that showed some abnormalities, but underwent nuclear stress testing in Dr. Michaelle Copas office, there was reportedly unremarkable.   Past Medical History:  Diagnosis Date   Gout    High cholesterol    OSA on CPAP    Tachycardia      Past Surgical History:  Procedure Laterality Date   PARTIAL KNEE ARTHROPLASTY Right    WISDOM TOOTH EXTRACTION       Social History   Tobacco Use  Smoking Status Never  Smokeless Tobacco Never    Social History   Substance and Sexual Activity  Alcohol Use No   Alcohol/week: 0.0 standard drinks of alcohol     Family History  Problem Relation Age of Onset   Lung cancer Mother    Hypertension Mother    Lupus Mother    COPD Mother    Arthritis Father       Current Outpatient Medications:    febuxostat (ULORIC) 40 MG  tablet, Take 80 mg by mouth daily., Disp: , Rfl:    Fenofibrate 120 MG TABS, Take by mouth., Disp: , Rfl:    olmesartan (BENICAR) 20 MG tablet, Take 20 mg by mouth daily., Disp: , Rfl:    rosuvastatin (CRESTOR) 5 MG tablet, Take 5 mg by mouth daily at 6 PM., Disp: , Rfl:    traMADol (ULTRAM) 50 MG tablet, Take 50-100 mg by mouth every 6 (six) hours as needed., Disp: , Rfl:    VASCEPA 1 g CAPS, Take 2 capsules by mouth 2 (two) times daily., Disp: , Rfl: 2   Cardiovascular and other pertinent studies:  Reviewed external labs and tests, independently interpreted  EKG 11/30/2021: Sinus rhythm 97 bpm Low voltage in precordial leads   Recent labs: 11/04/2021: Glucose 124, BUN/Cr 11/0.97. EGFR >60. Na/K 134/3.8.  H/H 14/41. MCV 82. Platelets 167 HbA1C 5.4% Chol 223, TG 512, HDL 38, LDL could not calculated  08/1130: TSH 3.1 normal    Review of Systems  Cardiovascular:  Positive for palpitations. Negative for chest pain, dyspnea on exertion, leg swelling and syncope.  Musculoskeletal:  Positive for joint pain.         Vitals:   11/30/21 0852 11/30/21 0902  BP: (!) 142/94 (!) 132/96  Pulse: 96 98  Resp: 16   Temp: 98 F (36.7 C)   SpO2:  96%      Body mass index is 33.89 kg/m. Filed Weights   11/30/21 0852  Weight: 243 lb (110.2 kg)     Objective:   Physical Exam Vitals and nursing note reviewed.  Constitutional:      General: He is not in acute distress. Neck:     Vascular: No JVD.  Cardiovascular:     Rate and Rhythm: Normal rate and regular rhythm.     Heart sounds: Normal heart sounds. No murmur heard. Pulmonary:     Effort: Pulmonary effort is normal.     Breath sounds: Normal breath sounds. No wheezing or rales.  Musculoskeletal:     Right lower leg: No edema.     Left lower leg: No edema.            Visit diagnoses:   ICD-10-CM   1. Tachycardia  R00.0 EKG 12-Lead    LONG TERM MONITOR (3-14 DAYS)    2. Atherosclerosis of native coronary  artery of native heart without angina pectoris  I25.10     3. Essential hypertension  I10 PCV ECHOCARDIOGRAM COMPLETE    4. Mixed hyperlipidemia  E78.2 Lipid panel    VASCEPA 1 g capsule    rosuvastatin (CRESTOR) 20 MG tablet       Orders Placed This Encounter  Procedures   Lipid panel   LONG TERM MONITOR (3-14 DAYS)   EKG 12-Lead   PCV ECHOCARDIOGRAM COMPLETE     Meds ordered this encounter  Medications   VASCEPA 1 g capsule    Sig: Take 2 capsules (2 g total) by mouth 2 (two) times daily.    Dispense:  120 capsule    Refill:  2   rosuvastatin (CRESTOR) 20 MG tablet    Sig: Take 1 tablet (20 mg total) by mouth daily at 6 PM.    Dispense:  90 tablet    Refill:  3     Assessment & Recommendations:    51 y.o. Caucasian male with hypertension, mixed hyperlipidemia, triglyceridemia, coronary atherosclerosis, OSA on CPAP, referred for evaluation of tachycardia.  Tachycardia: Sinus rhythm on resting EKG.  Elevated heart rate during physical activity, most likely due to deconditioning, recent weight gain.  Low suspicion for tachyarrhythmia.  Regardless, will use 2-week cardiac telemetry to evaluate.  We will also obtain echocardiogram.  Hypertension: Uncontrolled, but improving.  Several reversible risk factors, including OSA, weight gain, sedentary lifestyle owing to recent knee pain and knee surgery.  I have encouraged him to address the above, without adding any additional antihypertensive agent at this time.  Mixed hyperlipidemia: Longstanding hypertriglyceridemia, with familial component likely.  Discussed low carbohydrate and low saturated fat diet.  In addition, increase Crestor to 20 mg daily, continue Vascepa 2 g twice daily. We will repeat fasting lipid panel in 3 months.  Further recommendations after above testing.   Thank you for referring the patient to [place]. Please feel free to contact with any questions.   Nigel Mormon, MD Pager: [phone] Office:  [phone]

## 2021-12-15 ENCOUNTER — Ambulatory Visit: Payer: 59

## 2021-12-15 ENCOUNTER — Other Ambulatory Visit: Payer: 59

## 2021-12-15 DIAGNOSIS — I10 Essential (primary) hypertension: Secondary | ICD-10-CM

## 2022-01-25 ENCOUNTER — Telehealth: Payer: Self-pay | Admitting: Neurology

## 2022-01-25 NOTE — Telephone Encounter (Signed)
error 

## 2022-02-07 ENCOUNTER — Encounter: Payer: Self-pay | Admitting: Neurology

## 2022-02-07 ENCOUNTER — Ambulatory Visit (INDEPENDENT_AMBULATORY_CARE_PROVIDER_SITE_OTHER): Payer: 59 | Admitting: Neurology

## 2022-02-07 VITALS — BP 132/91 | HR 82 | Ht 71.0 in | Wt 251.0 lb

## 2022-02-07 DIAGNOSIS — G4733 Obstructive sleep apnea (adult) (pediatric): Secondary | ICD-10-CM

## 2022-02-07 DIAGNOSIS — F439 Reaction to severe stress, unspecified: Secondary | ICD-10-CM

## 2022-02-07 DIAGNOSIS — Z9989 Dependence on other enabling machines and devices: Secondary | ICD-10-CM

## 2022-02-07 NOTE — Patient Instructions (Signed)
It was nice to see you again today, as always. I am sorry to hear, that your mom is sick.   As discussed, we will proceed with a home sleep test (HST) to re-establish your sleep apnea diagnosis and to get you a new machine. Our sleep lab staff will reach out to you to arrange for pickup and for tutorial of your test equipment. I will write for a new machine after your HST confirms your obstructive sleep apnea.   Please remember, you will not use your CPAP the night of your testing.  This is so we get diagnostic data, we do not need treatment data.  We will schedule a follow-up appointment after set up with your new machine, typically within 31 to 89 days post treatment start. You will need to show compliance with usage and fulfill a minimum usage percentage (this is an insurance requirement).  After you have done your home sleep test, you can resume using your current machine until you get a new one.   Please continue using your CPAP regularly. While your insurance requires that you use CPAP at least 4 hours each night on 70% of the nights, I recommend, that you not skip any nights and use it throughout the night if you can. Getting used to CPAP and staying with the treatment long term does take time and patience and discipline. Untreated obstructive sleep apnea when it is moderate to severe can have an adverse impact on cardiovascular health and raise her risk for heart disease, arrhythmias, hypertension, congestive heart failure, stroke and diabetes. Untreated obstructive sleep apnea causes sleep disruption, nonrestorative sleep, and sleep deprivation. This can have an impact on your day to day functioning and cause daytime sleepiness and impairment of cognitive function, memory loss, mood disturbance, and problems focussing. Using CPAP regularly can improve these symptoms.

## 2022-02-07 NOTE — Progress Notes (Signed)
Subjective:    Patient ID: Chad Moran is a 51 y.o. male.  HPI    Interim history:   Chad Moran is a 51 year old right-handed gentleman with an underlying medical history of obesity, palpitations, and hypertriglyceridemia, who presents for follow-up consultation of his obstructive sleep apnea, on treatment with CPAP. The patient is unaccompanied today and presents for his yearly checkup. He presents after over 18 months. I last saw him on 05/27/20, at which time he was not fully compliant with his CPAP.  He was encouraged to be consistent with it.  Today, 02/07/22: I reviewed his CPAP compliance data from 01/08/2022 through 02/06/2022, which is a total of 30 days, during which time he used his machine 29 days with percent use days greater than 4 hours at 87%, indicating very good compliance with an average usage of 5 hours and 53 minutes, residual AHI at goal at 3.9/h, leak acceptable with the 95th percentile at 4.7 L/min on a pressure of 9 cm with EPR of 1.  He reports intermittent stressors.  He reports that his mom is in the hospital currently.  She also had a stroke a few months ago.  He reports having had residual knee pain, he is currently in PT twice a week through [facility].  He had partial knee replacement a few months ago.  He had to go to the emergency room in June 2023 with severe elevation in his blood pressure.  He has also seen cardiology in the interim.  He admits that he had come off of his CPAP completely for several months.  His cardiologist encouraged him to get back on it.  He had tachycardia and elevated blood pressure values.  He admits to sleeping a lot better when he is consistent with his CPAP.  He should be eligible for new equipment, set up was in 2016.  He has had significant weight gain since he injured his knee several years ago.  He is hoping to work on weight loss.   The patient's allergies, current medications, family history, past medical history, past social  history, past surgical history and problem list were reviewed and updated as appropriate.    Previously (copied from previous notes for reference):    I saw him on 05/28/2019, at which time he was not fully compliant with his CPAP as he indicated he had trouble taking it back and forth from work.  He also had a lapse in his treatment because of lack of supplies.  He did get new supplies.  He requested a secondary machine for his workplace. I provided a prescription for a second CPAP machine.    I reviewed his CPAP compliance data from the past 90 days, from 02/26/2020 through 05/25/2020, he used his machine only 34 days with percent use days greater than 4 hours at 33%.  Average usage for days on treatment of 5 hours and 25 minutes, average AHI at goal at 2.5, average leak on the low side, pressure at 9 cm with EPR of 1.      I saw him on 05/20/2018, at which time he was compliant with his CPAP.  He was reporting some interim stressors and flareup of back pain.    I reviewed his CPAP compliance data from 04/22/2019 through 05/21/2019 which is a total of 30 days, during which time he used his machine only 13 days with percent use days greater than 4 hours at only 27%, indicating significantly suboptimal compliance especially compared to last year,  average usage of 4 hours and 47 minutes for days on treatment, residual AHI at goal at 1.7/h, leak acceptable with a 95th percentile at 9.9 L/min on a pressure of 9 cm with EPR of 1.      I saw him on 01/08/2017, at which time he was not fully compliant with CPAP, he had flareup of allergies needed new supplies. He was diagnosed with low testosterone in the interim.   I reviewed his CPAP compliance data from 04/15/2018 through 05/14/2018 which is a total of 30 days, during which time he used his machine 29 days with percent used days greater than 4 hours at 80%, indicating very good compliance with an average usage of 5 hours and 53 minutes, residual AHI at goal  at 2.4 per hour, leak on the lower end with the 95th percentile at 7.2 L/m on a pressure of 9 cm.    I saw him on 05/06/2015, at which time he was working on his psychology degree, also working full-time as a Airline pilot. He was not fully compliant with CPAP, more suboptimal. He was planning on making some changes to his schedule to help reduce his stress level. I asked him to be fully compliant with CPAP and follow-up in 6 months.   I reviewed his CPAP compliance data from 12/05/2016 through 01/03/2017 which is a total of 30 days, during which time he used his CPAP only 17 days with percent used days greater than 4 hours at 23%, indicating poor compliance with an average usage of 3 hours and 33 minutes, residual AHI of 1.7 per hour, leak on the high side with the 95th percentile at 20.9 L/m on a pressure of 9 cm, in the past 90 days his compliance percentage has been similar at 28%.    I saw him on 11/03/2014, at which time he reported that he was still trying to get adjusted to treatment. He was compliant with treatment. He had noticed some improvement of his sleep including improvement of morning headaches. He was trying to lose weight. He was motivated to continue with treatment.    I reviewed his CPAP compliance data from 04/04/2015 through 05/03/2015 which is a total of 30 days during which time he used his machine 26 days with percent used days greater than 4 hours of only 50%, indicating suboptimal compliance with an average usage of 3 hours and 11 minutes only, residual AHI 2.5 per hour, leak acceptable with the 95th percentile at 17.2 L/m on a pressure of 9 cm with EPR of 1.     I first met him on 07/29/2014 at the request of his primary care physician, at which time the patient reported snoring and witnessed apneas while asleep. I invited him back for sleep study. He had a split-night sleep study on 08/18/2014 and I went over his test results with him in detail today. His baseline sleep  efficiency was 67.8% with a latency to sleep of 34.5 minutes and wake after sleep onset of 39 minutes with severe sleep fragmentation noted. The arousal index was 24.5 per hour, sleep distribution showed an increased percentage of stage I sleep, and a prolonged REM latency. He had mild to moderate snoring. He slept primarily on his back. Total AHI was 36.5 per hour. Baseline oxygen saturation 92%, nadir was 86%. He was then titrated on CPAP from a pressure of 5 cm to 11 cm. The AHI was 3.6 per hour and a pressure of 9 cm. REM sleep was not  achieved during the treatment portion of the study. No significant PLMS were noted during the study. Based on his test results are prescribed CPAP therapy for home use at a pressure of 9 cm.   I reviewed his CPAP compliance data from 09/30/2014 through 10/29/2014 which is a total of 30 days during which time he used his machine every night with percent used days greater than 4 hours at 83%, indicating very good compliance with an average usage of 5 hours and 18 minutes, residual AHI acceptable at 2 per hour, leak acceptable with the 95th percentile at 17.4 L/m on a pressure of 9 cm with EPR of 2.   He is reported to snore and have witnessed apneas while asleep per wife's report. He is significantly sleepy but has a hard time sleeping or napping. He works full-time as a Airline pilot. He snores loudly at times. Often he has more than 5 awakenings at night and is not rested when he first wakes up. He has to get up for work around 6:30 and does not feel good. He has a variable bedtime. He may not fall asleep quickly at all and then may wake up 5-6 times at night. He does not drink a lot of caffeine maybe 1-2 cups per day. He does not smoke and he does not drink alcohol on a regular basis. His father snores loudly but has not been tested for OSA. His mother has a history of lung cancer, thyroid disease, and lupus as well as COPD. He went to the emergency room with dizziness,  palpitations, flushing, itching of his palms and hands and nausea as well as significant diarrhea. He has had similar episodes infrequently before. He has seen cardiology some 10 years ago and was told he may have irregular heartbeat. He denies any parasomnias. His Epworth sleepiness score is 17 out of 24 today. He has woken himself up with a sense of gasping for air. He is not sure if he twitches or kicks in his sleep.  His Past Medical History Is Significant For: Past Medical History:  Diagnosis Date   Gout    High cholesterol    OSA on CPAP    Tachycardia     His Past Surgical History Is Significant For: Past Surgical History:  Procedure Laterality Date   PARTIAL KNEE ARTHROPLASTY Right    WISDOM TOOTH EXTRACTION      His Family History Is Significant For: Family History  Problem Relation Age of Onset   Lung cancer Mother    Hypertension Mother    Lupus Mother    COPD Mother    Arthritis Father    Hyperlipidemia Brother     His Social History Is Significant For: Social History   Socioeconomic History   Marital status: Married    Spouse name: Anderson Malta   Number of children: 2   Years of education: Associates   Highest education level: Not on file  Occupational History   Occupation: Doctor, hospital: [employer]    Comment: [employer]  Tobacco Use   Smoking status: Never   Smokeless tobacco: Never  Vaping Use   Vaping Use: Never used  Substance and Sexual Activity   Alcohol use: No    Alcohol/week: 0.0 standard drinks of alcohol   Drug use: No   Sexual activity: Not on file  Other Topics Concern   Not on file  Social History Narrative   Patient lives at home with his wife Anderson Malta).   Education  college.   Left handed.   Caffeine sweet tea three daily.   Social Determinants of Health   Financial Resource Strain: Not on file  Food Insecurity: Not on file  Transportation Needs: Not on file  Physical Activity: Not on file  Stress: Not on file   Social Connections: Not on file    His Allergies Are:  Allergies  Allergen Reactions   Naproxen   :   His Current Medications Are:  Outpatient Encounter Medications as of 02/07/2022  Medication Sig   GINSENG PO Take 2 each by mouth daily.   Omega-3 Fatty Acids (FISH OIL PO) Take 2,000-3,000 mg by mouth daily.   Red Yeast Rice Extract (RED YEAST RICE PO) Take 2-3 each by mouth daily. After dinner   febuxostat (ULORIC) 40 MG tablet Take 80 mg by mouth daily. (Patient not taking: Reported on 02/07/2022)   Fenofibrate 120 MG TABS Take by mouth. (Patient not taking: Reported on 02/07/2022)   olmesartan (BENICAR) 20 MG tablet Take 20 mg by mouth daily. (Patient not taking: Reported on 02/07/2022)   rosuvastatin (CRESTOR) 20 MG tablet Take 1 tablet (20 mg total) by mouth daily at 6 PM. (Patient not taking: Reported on 02/07/2022)   traMADol (ULTRAM) 50 MG tablet Take 50-100 mg by mouth every 6 (six) hours as needed. (Patient not taking: Reported on 02/07/2022)   VASCEPA 1 g capsule Take 2 capsules (2 g total) by mouth 2 (two) times daily. (Patient not taking: Reported on 02/07/2022)   No facility-administered encounter medications on file as of 02/07/2022.  :  Review of Systems:  Out of a complete 14 point review of systems, all are reviewed and negative with the exception of these symptoms as listed below:  Review of Systems  Neurological:        Patient is here alone for CPAP follow-up. He denies any current issues. States the humidity was off previously and it was very dry but he believes he figured it out. ESS 9 FSS 29.    Objective:  Neurological Exam  Physical Exam Physical Examination:   Vitals:   02/07/22 1429  BP: (!) 132/91  Pulse: 82    General Examination: The patient is a very pleasant 51 y.o. male in no acute distress. He appears well-developed and well-nourished and well groomed.   HEENT: Normocephalic, atraumatic, pupils are equal, round and reactive, extraocular  tracking is preserved. Hearing is grossly intact. Face is symmetric with normal facial animation and normal facial sensation. Speech is clear with no dysarthria noted. There is no hypophonia. There is no lip, neck/head, jaw or voice tremor. Neck shows FROM. Airway examination shows moderate mouth dryness, otherwise stable findings.     Chest: Clear to auscultation without wheezing, rhonchi or crackles noted.   Heart: S1+S2+0, regular and normal without murmurs, rubs or gallops noted.    Abdomen: Soft, non-tender and non-distended.   Extremities: There is no pitting edema in the distal lower extremities bilaterally.   Skin: Warm and dry without trophic changes noted.    Musculoskeletal: exam reveals no obvious joint deformities.    Neurologically:  Mental status: The patient is awake, alert and oriented in all 4 spheres. His immediate and remote memory, attention, language skills and fund of knowledge are appropriate. There is no evidence of aphasia, agnosia, apraxia or anomia. Speech is clear with normal prosody and enunciation. Thought process is linear. Mood is normal and affect is normal.  Cranial nerves II - XII are as described above  under HEENT exam.  Motor exam: Normal bulk, strength and tone is noted. Fine motor skills and coordination: grossly intact.   Cerebellar testing: No dysmetria or intention tremor on finger to nose testing. Heel to shin is unremarkable bilaterally. There is no truncal or gait ataxia.  Sensory exam: intact to light touch.  Gait, station and balance: Normal, no issues.    Assessment and Plan:  In summary, Chad Moran is a very pleasant 51 year old male with an underlying medical history of obesity, palpitations, and hypertriglyceridemia, status post right partial knee replacement, who presents for follow-up consultation of his OSA, on treatment with CPAP.  Of note, he had a split-night sleep study on 08/18/2014.  He had quit using his CPAP but is now  consistent with it.  He has seen cardiology in July 2023 and went to the emergency room in June 2023 with highly elevated blood pressure values.  He is commended for his treatment adherence and encouraged to continue with his CPAP of 9 cm for now.  We will proceed with a home sleep test to reevaluate his sleep apnea and to qualify him for a new CPAP machine.  I will write for a new machine after his home sleep test.  I explained the procedure to the patient.  He has had interim stressors particularly what with his mom in the hospital currently.  He is advised to stay well-hydrated and well rested.  We will plan a follow-up after home sleep testing and after issuing a new CPAP machine.  We will call him with his home sleep test results and take it from there.  I answered all his questions today and he was in agreement with our plan. I spent 40 minutes in total face-to-face time and in reviewing records during pre-charting, more than 50% of which was spent in counseling and coordination of care, reviewing test results, reviewing medications and treatment regimen and/or in discussing or reviewing the diagnosis of OSA, the prognosis and treatment options. Pertinent laboratory and imaging test results that were available during this visit with the patient were reviewed by me and considered in my medical decision making (see chart for details).

## 2022-02-15 ENCOUNTER — Telehealth: Payer: Self-pay | Admitting: Neurology

## 2022-02-15 NOTE — Telephone Encounter (Signed)
Mailout device- [facility]c no auth req.  Sent out on 02/16/22.

## 2022-02-16 ENCOUNTER — Ambulatory Visit: Payer: 59 | Admitting: Neurology

## 2022-02-16 DIAGNOSIS — G4733 Obstructive sleep apnea (adult) (pediatric): Secondary | ICD-10-CM

## 2022-02-23 ENCOUNTER — Encounter: Payer: Self-pay | Admitting: Neurology

## 2022-02-28 ENCOUNTER — Other Ambulatory Visit: Payer: Self-pay

## 2022-02-28 DIAGNOSIS — E782 Mixed hyperlipidemia: Secondary | ICD-10-CM

## 2022-03-01 LAB — LIPID PANEL
Chol/HDL Ratio: 5.7 ratio — ABNORMAL HIGH (ref 0.0–5.0)
Cholesterol, Total: 194 mg/dL (ref 100–199)
HDL: 34 mg/dL — ABNORMAL LOW (ref >39)
LDL Chol Calc (NIH): 79 mg/dL (ref 0–99)
Triglycerides: 504 mg/dL — ABNORMAL HIGH (ref 0–149)
VLDL Cholesterol Cal: 81 mg/dL — ABNORMAL HIGH (ref 5–40)

## 2022-03-01 NOTE — Addendum Note (Signed)
Addended by: Star Age on: 03/01/2022 05:03 PM   Modules accepted: Orders

## 2022-03-01 NOTE — Procedures (Signed)
   [facility]  HOME SLEEP TEST (Watch PAT) REPORT: Mail out unit.  STUDY DATE: 02/27/2022  DOB: [birth year]  MRN: [id number]  ORDERING CLINICIAN: Star Age, MD, PhD   REFERRING CLINICIAN: Ginger Organ., MD   CLINICAL INFORMATION/HISTORY: 51 year old male with a history of obesity, palpitations, and hypertriglyceridemia, who presents for reevaluation of his obstructive sleep apnea.  He has been on CPAP therapy for years.  He should be eligible for new equipment.   Epworth sleepiness score: 17/24.  BMI: 35.2 kg/m  FINDINGS:   Sleep Summary:   Total Recording Time (hours, min): 7 hours, 49 min  Total Sleep Time (hours, min):  7 hours, 6 min  Percent REM (%):    30.3%   Respiratory Indices:   Calculated pAHI (per hour):  55.6/hour         REM pAHI:    55/hour       NREM pAHI: 55.9/hour  Central pAHI: 4.9/hour  Oxygen Saturation Statistics:    Oxygen Saturation (%) Mean: 92%   Minimum oxygen saturation (%):                 82%   O2 Saturation Range (%): 82-98%    O2 Saturation (minutes) <=88%: 2.8 min  Pulse Rate Statistics:   Pulse Mean (bpm):    65/min    Pulse Range (51-111/min)   IMPRESSION: OSA (obstructive sleep apnea), severe  RECOMMENDATION:  This home sleep test demonstrates severe obstructive sleep apnea with a total AHI of 55.6/hour and O2 nadir of 82%.  Snoring ranged from mild to loud.  Ongoing treatment with positive airway pressure is highly recommended. The has been stable on CPAP of 9 cm.  I will write for a new CPAP, maintaining the current pressure settings.  Concomitant weight loss is recommended. Please note, that untreated obstructive sleep apnea may carry additional perioperative morbidity. Patients with significant obstructive sleep apnea should receive perioperative PAP therapy and the surgeons and particularly the anesthesiologist should be informed of the diagnosis and the severity of the sleep disordered  breathing. The patient should be cautioned not to drive, work at heights, or operate dangerous or heavy equipment when tired or sleepy. Review and reiteration of good sleep hygiene measures should be pursued with any patient. Other causes of the patient's symptoms, including circadian rhythm disturbances, an underlying mood disorder, medication effect and/or an underlying medical problem cannot be ruled out based on this test. Clinical correlation is recommended. The patient and his referring provider will be notified of the test results. The patient will be seen in follow up in sleep clinic at [facility].  I certify that I have reviewed the raw data recording prior to the issuance of this report in accordance with the standards of the American Academy of Sleep Medicine (AASM).  INTERPRETING PHYSICIAN:   Star Age, MD, PhD  Board Certified in Neurology and Sleep Medicine  [facility] [address] [place], [place] [address] [phone]

## 2022-03-02 ENCOUNTER — Telehealth: Payer: Self-pay | Admitting: *Deleted

## 2022-03-02 NOTE — Telephone Encounter (Addendum)
Spoke with patient.  He understands his home sleep test confirmed severe sleep apnea.  He would like to continue using [employer] for his DME company.  He is aware the order will be sent over there.  He will receive a call within 1 week.  He scheduled an initial follow-up appointment for May 30 2022 at 9:45 AM arrive 15 to 30 minutes early.  He understands the compliance requirements which includes using the machine at least 4 hours at night and being seen here at our office between 31 and 90 days after setup.  His questions were answered and he verbalized appreciation for the call.  Order sent to [employer].

## 2022-03-02 NOTE — Telephone Encounter (Signed)
-----   Message from Star Age, MD sent at 03/01/2022  5:03 PM EDT ----- Patient has been on CPAP of 9 cm.  He qualified for new equipment and had a home sleep test on 06/30/2021.  Please advise him that his sleep test confirms severe sleep apnea and that we will send an order for a new CPAP machine to his current DME company.  He will need a follow-up appointment within 1 to 3 months after set up with a new machine.  Please stress the importance of ongoing compliance with treatment.

## 2022-03-06 NOTE — Telephone Encounter (Signed)
Chad Moran w/ [employer] confirmed receipt of order on Fri 03/03/22.

## 2022-03-08 ENCOUNTER — Ambulatory Visit: Payer: 59

## 2022-03-08 ENCOUNTER — Ambulatory Visit: Payer: 59 | Admitting: Cardiology

## 2022-03-08 VITALS — BP 144/96 | HR 93 | Temp 97.8°F | Resp 16 | Ht 71.0 in | Wt 252.0 lb

## 2022-03-08 DIAGNOSIS — I10 Essential (primary) hypertension: Secondary | ICD-10-CM

## 2022-03-08 DIAGNOSIS — E782 Mixed hyperlipidemia: Secondary | ICD-10-CM

## 2022-03-08 DIAGNOSIS — R000 Tachycardia, unspecified: Secondary | ICD-10-CM

## 2022-03-08 NOTE — Progress Notes (Signed)
Patient referred by Ginger Organ., MD for tachycardia  Subjective:   Chad Moran, male    DOB: 02-Mar-1971, 51 y.o.   MRN: [id number]  Chief Complaint  Patient presents with   Follow-up    Hypertension   51 y.o. Caucasian male with hypertension, mixed hyperlipidemia, triglyceridemia, coronary atherosclerosis, OSA on CPAP, referred for evaluation of tachycardia.  Patient is a Airline pilot. He recently underwent right knee surgery, and is recovering from the same.  Prior to surgery, he had strep throat.  Around that time, he noticed his blood pressure and heart rate were running higher than usual.  He denies any chest pain or shortness of breath.  He had an ER visit sometime back, with elevated blood pressure, which is since improved after being on olmesartan 20 mg daily.  He presents today for 3 month follow-up. Overall, he is doing well without any complaints. He denies chest pain, shortness of breath, palpitations, leg edema, orthopnea, PND, TIA/syncope.  Past Medical History:  Diagnosis Date   Gout    High cholesterol    OSA on CPAP    Tachycardia    Past Surgical History:  Procedure Laterality Date   PARTIAL KNEE ARTHROPLASTY Right    WISDOM TOOTH EXTRACTION     Social History   Tobacco Use  Smoking Status Never  Smokeless Tobacco Never   Social History   Substance and Sexual Activity  Alcohol Use No   Alcohol/week: 0.0 standard drinks of alcohol   Family History  Problem Relation Age of Onset   Lung cancer Mother    Hypertension Mother    Lupus Mother    COPD Mother    Arthritis Father    Hyperlipidemia Brother     Current Outpatient Medications:    Omega-3 Fatty Acids (FISH OIL PO), Take 2,000-3,000 mg by mouth daily., Disp: , Rfl:    Red Yeast Rice Extract (RED YEAST RICE PO), Take 2-3 each by mouth daily. After dinner, Disp: , Rfl:    febuxostat (ULORIC) 40 MG tablet, Take 80 mg by mouth daily. (Patient not taking: Reported on 02/07/2022), Disp: ,  Rfl:    Fenofibrate 120 MG TABS, Take by mouth. (Patient not taking: Reported on 02/07/2022), Disp: , Rfl:    GINSENG PO, Take 2 each by mouth daily. (Patient not taking: Reported on 03/08/2022), Disp: , Rfl:    olmesartan (BENICAR) 20 MG tablet, Take 20 mg by mouth daily. (Patient not taking: Reported on 02/07/2022), Disp: , Rfl:    traMADol (ULTRAM) 50 MG tablet, Take 50-100 mg by mouth every 6 (six) hours as needed. (Patient not taking: Reported on 02/07/2022), Disp: , Rfl:    Cardiovascular and other pertinent studies:  Echocardiogram 7/98/9211 Normal LV systolic function with EF 57%. Left ventricle cavity is normal in size. Normal left ventricular wall thickness. Normal global wall motion.  Right ventricle cavity is mildly dilated. Normal right ventricular function. Structurally normal tricuspid valve.  Mild tricuspid regurgitation. RVSP measures 30 mmHg. Pericardium is normal. Small pericardial effusion. There is no hemodynamic significance.  Mobile cardiac telemetry 12 days 11/30/2021 - 12/13/2021: Dominant rhythm: Sinus. HR 55-160 bpm. Avg HR 89 bpm, in sinus rhythm. 1 episode of atrial tachycardia at 184 bpm for 5 beats. <1% isolated SVE, couplet/triplets. 0 episodes of VT. <1% isolated VE, no couplet/triplets. No atrial fibrillation/atrial flutter/VT/high grade AV block, sinus pause >3sec noted. 3 patient triggered events, correlated with sinus tachycardia.  Reviewed external labs and tests, independently interpreted  EKG  11/30/2021: Sinus rhythm 97 bpm Low voltage in precordial leads   Recent labs: Lipid Panel     Component Value Date/Time   CHOL 194 02/28/2022 0907   TRIG 504 (H) 02/28/2022 0907   HDL 34 (L) 02/28/2022 0907   CHOLHDL 5.7 (H) 02/28/2022 0907   LDLCALC 79 02/28/2022 0907   LABVLDL 81 (H) 02/28/2022 0907      Latest Ref Rng & Units 11/04/2021    1:24 PM 06/20/2014    7:56 PM  BMP  Glucose 70 - 99 mg/dL 099  833   BUN 6 - 20 mg/dL 11  22   Creatinine  8.25 - 1.24 mg/dL 0.53  9.76   Sodium 734 - 145 mmol/L 134  139   Potassium 3.5 - 5.1 mmol/L 3.8  3.5   Chloride 98 - 111 mmol/L 97  102   CO2 22 - 32 mmol/L 29  22   Calcium 8.9 - 10.3 mg/dL 9.4  9.7    CBC    Component Value Date/Time   WBC 10.7 (H) 11/04/2021 1324   RBC 5.04 11/04/2021 1324   HGB 14.3 11/04/2021 1324   HCT 41.7 11/04/2021 1324   PLT 167 11/04/2021 1324   MCV 82.7 11/04/2021 1324   MCH 28.4 11/04/2021 1324   MCHC 34.3 11/04/2021 1324   RDW 13.4 11/04/2021 1324    Review of Systems  Cardiovascular:  Negative for chest pain, dyspnea on exertion, leg swelling, palpitations and syncope.  Neurological:  Negative for dizziness and light-headedness.    Vitals:   03/08/22 1310  BP: (!) 144/96  Pulse: 93  Resp: 16  Temp: 97.8 F (36.6 C)  SpO2: 96%    Body mass index is 35.15 kg/m. Filed Weights   03/08/22 1310  Weight: 252 lb (114.3 kg)     Objective:   Physical Exam Vitals and nursing note reviewed.  Constitutional:      General: He is not in acute distress. Neck:     Vascular: No JVD.  Cardiovascular:     Rate and Rhythm: Normal rate and regular rhythm.     Heart sounds: Normal heart sounds. No murmur heard. Pulmonary:     Effort: Pulmonary effort is normal.     Breath sounds: Normal breath sounds. No wheezing or rales.  Musculoskeletal:     Right lower leg: No edema.     Left lower leg: No edema.    Visit diagnoses:   ICD-10-CM   1. Primary hypertension  I10     2. Tachycardia  R00.0     3. Mixed hyperlipidemia  E78.2        No orders of the defined types were placed in this encounter.    No orders of the defined types were placed in this encounter.    Assessment & Recommendations:   51 y.o. Caucasian male with hypertension, mixed hyperlipidemia, triglyceridemia, coronary atherosclerosis, OSA on CPAP, referred for evaluation of tachycardia.  Tachycardia: Elevated heart rate during physical activity, most likely due to  deconditioning, recent weight gain.  Low suspicion for tachyarrhythmia.   Reviewed 2 week cardiac event monitor findings. Feel that previous symptoms are related to PCVs and PACs. Given that he is not currently symptomatic will not start beta blocker at this time. Reviewed echo results with patient.   Hypertension: Uncontrolled, but improving.  Several reversible risk factors, including OSA, weight gain, sedentary lifestyle owing to recent knee pain and knee surgery.  He does not want to start medication at  this time. I have discussed the important of lifestyle modifications and he is motivated to makes changes.  Mixed hyperlipidemia: Longstanding hypertriglyceridemia, with familial component likely.  Discussed low carbohydrate and low saturated fat diet. He has stopped taking Crestor and Vascepa due to side effects. Reviewed labs, last LDL 79. I did discuss Repatha and he would like to hold off on additional medications and make lifestyle changes.  Follow-up in 3 months or sooner if needed.   Nori Riis, [place] Pager: [phone] Office: [phone]

## 2022-05-30 ENCOUNTER — Ambulatory Visit: Payer: 59 | Admitting: Neurology

## 2022-05-30 ENCOUNTER — Encounter: Payer: Self-pay | Admitting: Neurology

## 2022-05-30 VITALS — BP 130/85 | HR 65 | Ht 71.0 in | Wt 242.8 lb

## 2022-05-30 DIAGNOSIS — G4733 Obstructive sleep apnea (adult) (pediatric): Secondary | ICD-10-CM

## 2022-05-30 NOTE — Patient Instructions (Signed)
It was nice to see you again today. I am glad to hear, things are going well with your new CPAP machine. You are compliant with it. You have also fulfilled the insurance-mandated compliance percentage, which is reassuring, so you can get ongoing supplies through your insurance. Please talk to your DME provider about getting replacement supplies on a regular basis. Please be sure to change your filter every month, your mask about every 3 months, hose about every 6 months, humidifier chamber about yearly. Some restrictions are imposed by your insurance carrier with regard to how frequently you can get certain supplies.  Your DME company can provide further details if necessary.   Please continue using your CPAP regularly. While your insurance requires that you use PAP at least 4 hours each night on 70% of the nights, I recommend, that you not skip any nights and use it throughout the night if you can. Getting used to PAP and staying with the treatment long term does take time and patience and discipline. Untreated obstructive sleep apnea when it is moderate to severe can have an adverse impact on cardiovascular health and raise her risk for heart disease, arrhythmias, hypertension, congestive heart failure, stroke and diabetes. Untreated obstructive sleep apnea causes sleep disruption, nonrestorative sleep, and sleep deprivation. This can have an impact on your day to day functioning and cause daytime sleepiness and impairment of cognitive function, memory loss, mood disturbance, and problems focussing. Using PAP regularly can improve these symptoms.  We can see you in 1 year, you can see one of our nurse practitioners as you are stable.

## 2022-05-30 NOTE — Progress Notes (Signed)
Subjective:    Patient ID: Chad Moran is a 52 y.o. male.  HPI    Interim history:   Chad Moran is a 52 year old right-handed gentleman with an underlying medical history of obesity, palpitations, and hypertriglyceridemia, who presents for follow-up consultation of his obstructive sleep apnea, after a recent HST and starting CPAP therapy with a new machine.  The patient is unaccompanied today.  I last saw him on 02/07/2022, at which time he was compliant with his CPAP of 9 cm with EPR of 1.  He was struggling with significant knee pain and was in physical therapy, he was seeing orthopedics.  He reported that he had a lapse in PAP therapy but was encouraged by his cardiologist to get back on it.  He had an older machine and we proceeded with reevaluation with a home sleep test.  He had a home sleep test on 02/27/2022 which showed severe obstructive sleep apnea with a total AHI of 55.6/hour and O2 nadir of 82%.  Snoring ranged from mild to loud.  Based on the test results I wrote for a new CPAP machine with a set pressure of 9 cm, EPR of 1.  His set up date was 04/12/2022.  He has a ResMed air sense 11 AutoSet machine, DME company is [employer].   Today, 05/30/2022: I reviewed his CPAP compliance data from 04/28/2022 through 05/27/2022, which is a total of 30 days, during which time he used his machine 24 days with percent use days greater than 4 hours at 77%, indicating adequate compliance with an average usage of 5 hours and 36 minutes, residual AHI borderline at 5.1/h, leak on the low side with the 95th percentile at 2.2 L/min on a pressure of 9 cm with EPR of 1.  He reports doing well. 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 He has an upcoming physical agility assessment through work.  Has had some stress regarding his mom's health but she is overall doing a little better.  Blood pressure is coming down a little bit, weight is slowly coming down as well.  He is working  on weight loss.  The patient's allergies, current medications, family history, past medical history, past social history, past surgical history and problem list were reviewed and updated as appropriate.    Previously (copied from previous notes for reference):    I reviewed his CPAP compliance data from 01/08/2022 through 02/06/2022, which is a total of 30 days, during which time he used his machine 29 days with percent use days greater than 4 hours at 87%, indicating very good compliance with an average usage of 5 hours and 53 minutes, residual AHI at goal at 3.9/h, leak acceptable with the 95th percentile at 4.7 L/min on a pressure of 9 cm with EPR of 1.     I saw him on 05/28/2019, at which time he was not fully compliant with his CPAP as he indicated he had trouble taking it back and forth from work.  He also had a lapse in his treatment because of lack of supplies.  He did get new supplies.  He requested a secondary machine for his workplace. I provided a prescription for a second CPAP machine.    I reviewed his CPAP compliance data from the past 90 days, from 02/26/2020 through 05/25/2020, he used his machine only 34 days with percent use days greater than 4 hours at 33%.  Average usage for days on treatment of 5  hours and 25 minutes, average AHI at goal at 2.5, average leak on the low side, pressure at 9 cm with EPR of 1.      I saw him on 05/20/2018, at which time he was compliant with his CPAP.  He was reporting some interim stressors and flareup of back pain.    I reviewed his CPAP compliance data from 04/22/2019 through 05/21/2019 which is a total of 30 days, during which time he used his machine only 13 days with percent use days greater than 4 hours at only 27%, indicating significantly suboptimal compliance especially compared to last year, average usage of 4 hours and 47 minutes for days on treatment, residual AHI at goal at 1.7/h, leak acceptable with a 95th percentile at 9.9 L/min on a  pressure of 9 cm with EPR of 1.      I saw him on 01/08/2017, at which time he was not fully compliant with CPAP, he had flareup of allergies needed new supplies. He was diagnosed with low testosterone in the interim.   I reviewed his CPAP compliance data from 04/15/2018 through 05/14/2018 which is a total of 30 days, during which time he used his machine 29 days with percent used days greater than 4 hours at 80%, indicating very good compliance with an average usage of 5 hours and 53 minutes, residual AHI at goal at 2.4 per hour, leak on the lower end with the 95th percentile at 7.2 L/m on a pressure of 9 cm.    I saw him on 05/06/2015, at which time he was working on his psychology degree, also working full-time as a IT sales professional. He was not fully compliant with CPAP, more suboptimal. He was planning on making some changes to his schedule to help reduce his stress level. I asked him to be fully compliant with CPAP and follow-up in 6 months.   I reviewed his CPAP compliance data from 12/05/2016 through 01/03/2017 which is a total of 30 days, during which time he used his CPAP only 17 days with percent used days greater than 4 hours at 23%, indicating poor compliance with an average usage of 3 hours and 33 minutes, residual AHI of 1.7 per hour, leak on the high side with the 95th percentile at 20.9 L/m on a pressure of 9 cm, in the past 90 days his compliance percentage has been similar at 28%.    I saw him on 11/03/2014, at which time he reported that he was still trying to get adjusted to treatment. He was compliant with treatment. He had noticed some improvement of his sleep including improvement of morning headaches. He was trying to lose weight. He was motivated to continue with treatment.    I reviewed his CPAP compliance data from 04/04/2015 through 05/03/2015 which is a total of 30 days during which time he used his machine 26 days with percent used days greater than 4 hours of only 50%,  indicating suboptimal compliance with an average usage of 3 hours and 11 minutes only, residual AHI 2.5 per hour, leak acceptable with the 95th percentile at 17.2 L/m on a pressure of 9 cm with EPR of 1.     I first met him on 07/29/2014 at the request of his primary care physician, at which time the patient reported snoring and witnessed apneas while asleep. I invited him back for sleep study. He had a split-night sleep study on 08/18/2014 and I went over his test results with him in detail today.  His baseline sleep efficiency was 67.8% with a latency to sleep of 34.5 minutes and wake after sleep onset of 39 minutes with severe sleep fragmentation noted. The arousal index was 24.5 per hour, sleep distribution showed an increased percentage of stage I sleep, and a prolonged REM latency. He had mild to moderate snoring. He slept primarily on his back. Total AHI was 36.5 per hour. Baseline oxygen saturation 92%, nadir was 86%. He was then titrated on CPAP from a pressure of 5 cm to 11 cm. The AHI was 3.6 per hour and a pressure of 9 cm. REM sleep was not achieved during the treatment portion of the study. No significant PLMS were noted during the study. Based on his test results are prescribed CPAP therapy for home use at a pressure of 9 cm.   I reviewed his CPAP compliance data from 09/30/2014 through 10/29/2014 which is a total of 30 days during which time he used his machine every night with percent used days greater than 4 hours at 83%, indicating very good compliance with an average usage of 5 hours and 18 minutes, residual AHI acceptable at 2 per hour, leak acceptable with the 95th percentile at 17.4 L/m on a pressure of 9 cm with EPR of 2.   He is reported to snore and have witnessed apneas while asleep per wife's report. He is significantly sleepy but has a hard time sleeping or napping. He works full-time as a Airline pilot. He snores loudly at times. Often he has more than 5 awakenings at night and is  not rested when he first wakes up. He has to get up for work around 6:30 and does not feel good. He has a variable bedtime. He may not fall asleep quickly at all and then may wake up 5-6 times at night. He does not drink a lot of caffeine maybe 1-2 cups per day. He does not smoke and he does not drink alcohol on a regular basis. His father snores loudly but has not been tested for OSA. His mother has a history of lung cancer, thyroid disease, and lupus as well as COPD. He went to the emergency room with dizziness, palpitations, flushing, itching of his palms and hands and nausea as well as significant diarrhea. He has had similar episodes infrequently before. He has seen cardiology some 10 years ago and was told he may have irregular heartbeat. He denies any parasomnias. His Epworth sleepiness score is 17 out of 24 today. He has woken himself up with a sense of gasping for air. He is not sure if he twitches or kicks in his sleep.    His Past Medical History Is Significant For: Past Medical History:  Diagnosis Date   Gout    High cholesterol    OSA on CPAP    Tachycardia     His Past Surgical History Is Significant For: Past Surgical History:  Procedure Laterality Date   PARTIAL KNEE ARTHROPLASTY Right    WISDOM TOOTH EXTRACTION      His Family History Is Significant For: Family History  Problem Relation Age of Onset   Lung cancer Mother    Hypertension Mother    Lupus Mother    COPD Mother    Arthritis Father    Hyperlipidemia Brother     His Social History Is Significant For: Social History   Socioeconomic History   Marital status: Married    Spouse name: Anderson Malta   Number of children: 2   Years of education:  Associates   Highest education level: Not on file  Occupational History   Occupation: Training and development officer: [employer]    Comment: [employer]  Tobacco Use   Smoking status: Never   Smokeless tobacco: Never  Vaping Use   Vaping Use: Never used  Substance  and Sexual Activity   Alcohol use: No    Alcohol/week: 0.0 standard drinks of alcohol   Drug use: No   Sexual activity: Not on file  Other Topics Concern   Not on file  Social History Narrative   Patient lives at home with his wife Victorino Dike).   Education college.   Left handed.   Caffeine sweet tea three daily.   Social Determinants of Health   Financial Resource Strain: Not on file  Food Insecurity: Not on file  Transportation Needs: Not on file  Physical Activity: Not on file  Stress: Not on file  Social Connections: Not on file    His Allergies Are:  Allergies  Allergen Reactions   Naproxen   :   His Current Medications Are:  Outpatient Encounter Medications as of 05/30/2022  Medication Sig   colchicine 0.6 MG tablet Take 0.6 mg by mouth daily. As needed for flares   Fenofibrate 120 MG TABS Take by mouth.   GINSENG PO Take 2 each by mouth daily.   olmesartan (BENICAR) 20 MG tablet Take 20 mg by mouth daily.   Omega-3 Fatty Acids (FISH OIL PO) Take 2,000-3,000 mg by mouth daily.   Red Yeast Rice Extract (RED YEAST RICE PO) Take 2-3 each by mouth daily. After dinner   traMADol (ULTRAM) 50 MG tablet Take 50-100 mg by mouth every 6 (six) hours as needed.   [DISCONTINUED] febuxostat (ULORIC) 40 MG tablet Take 80 mg by mouth daily. (Patient not taking: Reported on 02/07/2022)   No facility-administered encounter medications on file as of 05/30/2022.  :  Review of Systems:  Out of a complete 14 point review of systems, all are reviewed and negative with the exception of these symptoms as listed below:  Review of Systems  Neurological:        Intial cpap follow up.  Doing well. Feels rested.  ESS 10, FSS 20.      Objective:  Neurological Exam  Physical Exam Physical Examination:   There were no vitals filed for this visit.  General Examination: The patient is a very pleasant 52 y.o. male in no acute distress. He appears well-developed and well-nourished and well  groomed.   HEENT: Normocephalic, atraumatic, pupils are equal, round and reactive, extraocular tracking is preserved. Hearing is grossly intact. Face is symmetric with normal facial animation and normal facial sensation. Speech is clear with no dysarthria noted. There is no hypophonia. There is no lip, neck/head, jaw or voice tremor. Neck shows FROM. Airway examination shows moderate mouth dryness, otherwise stable findings.     Chest: Clear to auscultation without wheezing, rhonchi or crackles noted.   Heart: S1+S2+0, regular and normal without murmurs, rubs or gallops noted.    Abdomen: Soft, non-tender and non-distended.   Extremities: There is no pitting edema in the distal lower extremities bilaterally.   Skin: Warm and dry without trophic changes noted.    Musculoskeletal: exam reveals no obvious joint deformities, R knee discomfort.    Neurologically:  Mental status: The patient is awake, alert and oriented in all 4 spheres. His immediate and remote memory, attention, language skills and fund of knowledge are appropriate. There is no  evidence of aphasia, agnosia, apraxia or anomia. Speech is clear with normal prosody and enunciation. Thought process is linear. Mood is normal and affect is normal.  Cranial nerves II - XII are as described above under HEENT exam.  Motor exam: Normal bulk, strength and tone is noted. Fine motor skills and coordination: grossly intact.   Cerebellar testing: No dysmetria or intention tremor. There is no truncal or gait ataxia.  Sensory exam: intact to light touch.  Gait, station and balance: Normal posture, walks with an mild limp on the right.  No walking aid.  Assessment and Plan:  In summary, Chad Moran is a very pleasant 52 year old male with an underlying medical history of obesity, palpitations, and hypertriglyceridemia, status post right partial knee replacement, who presents for follow-up consultation of his OSA, on treatment with CPAP.  He had  a home sleep test on 02/27/2022 which showed severe obstructive sleep apnea with a total AHI of 55.6/h, O2 nadir 82% with variable snoring, ranging from mild to loud.  He established treatment with a new CPAP machine on 04/12/2022.  He has a backup machine at work.  He is compliant with treatment.  Previously, he had a split-night sleep study on 08/18/2014.  He is commended for his treatment adherence, he is working on weight loss as well.  He is advised to be consistent with his CPAP usage and follow-up routinely in 1 year.  He can see one of our nurse practitioners, we can offer him a video visit through MyChart if he prefers.  We talked about the importance of maintaining a healthy lifestyle, stress reduction, maintaining healthy weight.  I answered all his questions today and he was in agreement with our plan.  I spent 30 minutes in total face-to-face time and in reviewing records during pre-charting, more than 50% of which was spent in counseling and coordination of care, reviewing test results, reviewing medications and treatment regimen and/or in discussing or reviewing the diagnosis of OSA, the prognosis and treatment options. Pertinent laboratory and imaging test results that were available during this visit with the patient were reviewed by me and considered in my medical decision making (see chart for details).

## 2022-06-08 ENCOUNTER — Ambulatory Visit: Payer: Self-pay | Admitting: Internal Medicine

## 2022-06-08 ENCOUNTER — Ambulatory Visit: Payer: 59

## 2022-06-12 NOTE — Progress Notes (Signed)
No show

## 2022-07-27 ENCOUNTER — Encounter: Payer: Self-pay | Admitting: Radiology

## 2023-05-07 ENCOUNTER — Other Ambulatory Visit: Payer: Self-pay | Admitting: Internal Medicine

## 2023-05-07 DIAGNOSIS — I2510 Atherosclerotic heart disease of native coronary artery without angina pectoris: Secondary | ICD-10-CM

## 2023-05-24 ENCOUNTER — Encounter: Payer: Self-pay | Admitting: *Deleted

## 2023-05-24 NOTE — Progress Notes (Signed)
 Chad Moran

## 2023-05-28 ENCOUNTER — Telehealth: Payer: 59 | Admitting: Family Medicine

## 2023-05-28 ENCOUNTER — Encounter: Payer: Self-pay | Admitting: Family Medicine

## 2023-05-28 DIAGNOSIS — G4733 Obstructive sleep apnea (adult) (pediatric): Secondary | ICD-10-CM | POA: Diagnosis not present

## 2023-05-28 NOTE — Patient Instructions (Signed)
Please continue using your CPAP regularly. While your insurance requires that you use CPAP at least 4 hours each night on 70% of the nights, I recommend, that you not skip any nights and use it throughout the night if you can. Getting used to CPAP and staying with the treatment long term does take time and patience and discipline. Untreated obstructive sleep apnea when it is moderate to severe can have an adverse impact on cardiovascular health and raise her risk for heart disease, arrhythmias, hypertension, congestive heart failure, stroke and diabetes. Untreated obstructive sleep apnea causes sleep disruption, nonrestorative sleep, and sleep deprivation. This can have an impact on your day to day functioning and cause daytime sleepiness and impairment of cognitive function, memory loss, mood disturbance, and problems focussing. Using CPAP regularly can improve these symptoms.  We will update supply orders, today.   Follow up in 6 months

## 2023-05-28 NOTE — Progress Notes (Signed)
 PATIENT: Chad Moran DOB: 29-Jun-1970  REASON FOR VISIT: follow up HISTORY FROM: patient  Virtual Visit via Telephone Note  I connected with Chad Moran on 05/28/23 at  9:30 AM EST by telephone and verified that I am speaking with the correct person using two identifiers.   I discussed the limitations, risks, security and privacy concerns of performing an evaluation and management service by telephone and the availability of in person appointments. I also discussed with the patient that there may be a patient responsible charge related to this service. The patient expressed understanding and agreed to proceed.   History of Present Illness:  05/28/23 ALL: IMMANUEL FEDAK is a 53 y.o. male here today for follow up for OSA on CPAP. He was last seen by Dr Buck 05/2022 and doing well. He continues to do well on therapy but admits that compliance has been lower than usual. 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 He typically works 4 days on and one day off. He does not take his machine to the station when working. He does note feeling better rested and more energized when using CPAP. He is planning retirement in the next few weeks.      History (copied from Dr Obie previous note)  Mr. Xu is a 53 year old right-handed gentleman with an underlying medical history of obesity, palpitations, and hypertriglyceridemia, who presents for follow-up consultation of his obstructive sleep apnea, after a recent HST and starting CPAP therapy with a new machine.  The patient is unaccompanied today.  I last saw him on 02/07/2022, at which time he was compliant with his CPAP of 9 cm with EPR of 1.  He was struggling with significant knee pain and was in physical therapy, he was seeing orthopedics.  He reported that he had a lapse in PAP therapy but was encouraged by his cardiologist to get back on it.  He had an older machine and we proceeded with reevaluation with a home sleep test.  He  had a home sleep test on 02/27/2022 which showed severe obstructive sleep apnea with a total AHI of 55.6/hour and O2 nadir of 82%.  Snoring ranged from mild to loud.  Based on the test results I wrote for a new CPAP machine with a set pressure of 9 cm, EPR of 1.  His set up date was 04/12/2022.  He has a ResMed air sense 11 AutoSet machine, DME company is [employer].    Today, 05/30/2022: I reviewed his CPAP compliance data from 04/28/2022 through 05/27/2022, which is a total of 30 days, during which time he used his machine 24 days with percent use days greater than 4 hours at 77%, indicating adequate compliance with an average usage of 5 hours and 36 minutes, residual AHI borderline at 5.1/h, leak on the low side with the 95th percentile at 2.2 L/min on a pressure of 9 cm with EPR of 1.  He reports doing well. 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 He has an upcoming physical agility assessment through work.  Has had some stress regarding his mom's health but she is overall doing a little better.  Blood pressure is coming down a little bit, weight is slowly coming down as well.  He is working on weight loss.   Observations/Objective:  Generalized: Well developed, in no acute distress  Mentation: Alert oriented to time, place, history taking. Follows all commands speech and language fluent   Assessment  and Plan:  53 y.o. year old male  has a past medical history of Gout, High cholesterol, OSA on CPAP, and Tachycardia. here with    ICD-10-CM   1. OSA on CPAP  G47.33 For home use only DME continuous positive airway pressure (CPAP)      Trice is doing well, although, compliance has been lower than usual. Compliance report shows 38% daily and 30% four hour usage. He was encouraged to use CPAP nightly for at least 4 hours. We will update supply orders. He will follow up with me in 6 months.   Orders Placed This Encounter  Procedures   For home use only DME continuous  positive airway pressure (CPAP)    Heated Humidity with all supplies as needed    Length of Need:   Lifetime    Patient has OSA or probable OSA:   Yes    Is the patient currently using CPAP in the home:   Yes    Settings:   Other see comments    CPAP supplies needed:   Mask, headgear, cushions, filters, heated tubing and water chamber    No orders of the defined types were placed in this encounter.    Follow Up Instructions:  I discussed the assessment and treatment plan with the patient. The patient was provided an opportunity to ask questions and all were answered. The patient agreed with the plan and demonstrated an understanding of the instructions.   The patient was advised to call back or seek an in-person evaluation if the symptoms worsen or if the condition fails to improve as anticipated.  I provided 15 minutes of non-face-to-face time during this encounter. Patient located at their place of residence during Mychart visit. Provider is in the office.    Luther Newhouse, NP

## 2023-06-19 ENCOUNTER — Ambulatory Visit
Admission: RE | Admit: 2023-06-19 | Discharge: 2023-06-19 | Disposition: A | Discharge status: Home / Self Care | Payer: No Typology Code available for payment source | Source: Ambulatory Visit | Attending: Nurse Practitioner | Admitting: Nurse Practitioner

## 2023-06-19 ENCOUNTER — Other Ambulatory Visit: Payer: Self-pay | Admitting: Nurse Practitioner

## 2023-06-19 DIAGNOSIS — Z0000 Encounter for general adult medical examination without abnormal findings: Secondary | ICD-10-CM

## 2023-12-05 NOTE — Progress Notes (Unsigned)
 PATIENT: Chad Moran DOB: 05-May-1971  REASON FOR VISIT: follow up HISTORY FROM: patient  Virtual Visit via Telephone Note  I connected with Chad Moran on 12/06/23 at  9:30 AM EDT by telephone and verified that I am speaking with the correct person using two identifiers.   I discussed the limitations, risks, security and privacy concerns of performing an evaluation and management service by telephone and the availability of in person appointments. I also discussed with the patient that there may be a patient responsible charge related to this service. The patient expressed understanding and agreed to proceed.   History of Present Illness:  12/06/23 ALL: [name] returns for follow up for OSA on CPAP. He was seen 05/2023 and struggling with compliance due to work schedule. Since, he reports doing better. He is now retired and using CPAP most every night. He does report feeling better rested. He continues to toss and turn at night but feels he is adjusting. He is using a memory foam mask. No concerns with machine.     05/28/2023 ALL:  Chad Moran is a 53 y.o. male here today for follow up for OSA on CPAP. He was last seen by Dr Buck 05/2022 and doing well. He continues to do well on therapy but admits that compliance has been lower than usual. 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 He typically works 4 days on and one day off. He does not take his machine to the station when working. He does note feeling better rested and more energized when using CPAP. He is planning retirement in the next few weeks.      History (copied from Dr Obie previous note)  Chad Moran is a 52 year old right-handed gentleman with an underlying medical history of obesity, palpitations, and hypertriglyceridemia, who presents for follow-up consultation of his obstructive sleep apnea, after a recent HST and starting CPAP therapy with a new machine.  The patient is unaccompanied today.  I last  saw him on 02/07/2022, at which time he was compliant with his CPAP of 9 cm with EPR of 1.  He was struggling with significant knee pain and was in physical therapy, he was seeing orthopedics.  He reported that he had a lapse in PAP therapy but was encouraged by his cardiologist to get back on it.  He had an older machine and we proceeded with reevaluation with a home sleep test.  He had a home sleep test on 02/27/2022 which showed severe obstructive sleep apnea with a total AHI of 55.6/hour and O2 nadir of 82%.  Snoring ranged from mild to loud.  Based on the test results I wrote for a new CPAP machine with a set pressure of 9 cm, EPR of 1.  His set up date was 04/12/2022.  He has a ResMed air sense 11 AutoSet machine, DME company is [employer].    Today, 05/30/2022: I reviewed his CPAP compliance data from 04/28/2022 through 05/27/2022, which is a total of 30 days, during which time he used his machine 24 days with percent use days greater than 4 hours at 77%, indicating adequate compliance with an average usage of 5 hours and 36 minutes, residual AHI borderline at 5.1/h, leak on the low side with the 95th percentile at 2.2 L/min on a pressure of 9 cm with EPR of 1.  He reports doing well. 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 He has an upcoming physical agility  assessment through work.  Has had some stress regarding his mom's health but she is overall doing a little better.  Blood pressure is coming down a little bit, weight is slowly coming down as well.  He is working on weight loss.   Observations/Objective:  Generalized: Well developed, in no acute distress  Mentation: Alert oriented to time, place, history taking. Follows all commands speech and language fluent   Assessment and Plan:  53 y.o. year old male  has a past medical history of Gout, High cholesterol, OSA on CPAP, and Tachycardia. here with    ICD-10-CM   1. OSA on CPAP  G47.33 For home use only DME  continuous positive airway pressure (CPAP)      [name] is doing well, although, compliance has been lower than usual. Compliance report now shows 83% daily and 77% four hour usage. He was encouraged to use CPAP nightly for at least 4 hours. We will update supply orders. He will follow up with me in 1 year.   Orders Placed This Encounter  Procedures   For home use only DME continuous positive airway pressure (CPAP)    Heated Humidity with all supplies as needed    Length of Need:   Lifetime    Patient has OSA or probable OSA:   Yes    Is the patient currently using CPAP in the home:   Yes    Settings:   Other see comments    CPAP supplies needed:   Mask, headgear, cushions, filters, heated tubing and water chamber    No orders of the defined types were placed in this encounter.    Follow Up Instructions:  I discussed the assessment and treatment plan with the patient. The patient was provided an opportunity to ask questions and all were answered. The patient agreed with the plan and demonstrated an understanding of the instructions.   The patient was advised to call back or seek an in-person evaluation if the symptoms worsen or if the condition fails to improve as anticipated.  I provided 15 minutes of non-face-to-face time during this encounter. Patient located at their place of residence during Mychart visit. Provider is in the office.    Davionna Blacksher, NP

## 2023-12-05 NOTE — Progress Notes (Unsigned)
 SABRA

## 2023-12-05 NOTE — Patient Instructions (Signed)

## 2023-12-06 ENCOUNTER — Telehealth (INDEPENDENT_AMBULATORY_CARE_PROVIDER_SITE_OTHER): Payer: 59 | Admitting: Family Medicine

## 2023-12-06 ENCOUNTER — Encounter: Payer: Self-pay | Admitting: Family Medicine

## 2023-12-06 DIAGNOSIS — G4733 Obstructive sleep apnea (adult) (pediatric): Secondary | ICD-10-CM

## 2023-12-13 NOTE — Progress Notes (Signed)
 Supplies order located on [employer]. Sent community message to [employer] that order placed.

## 2023-12-21 ENCOUNTER — Other Ambulatory Visit (HOSPITAL_BASED_OUTPATIENT_CLINIC_OR_DEPARTMENT_OTHER): Payer: Self-pay | Admitting: Family Medicine

## 2023-12-21 DIAGNOSIS — E785 Hyperlipidemia, unspecified: Secondary | ICD-10-CM

## 2024-01-07 ENCOUNTER — Ambulatory Visit (HOSPITAL_BASED_OUTPATIENT_CLINIC_OR_DEPARTMENT_OTHER)
Admission: RE | Admit: 2024-01-07 | Discharge: 2024-01-07 | Disposition: A | Discharge status: Home / Self Care | Payer: Self-pay | Source: Ambulatory Visit | Attending: Family Medicine | Admitting: Family Medicine

## 2024-01-07 DIAGNOSIS — E785 Hyperlipidemia, unspecified: Secondary | ICD-10-CM | POA: Insufficient documentation

## 2024-01-17 ENCOUNTER — Other Ambulatory Visit (HOSPITAL_COMMUNITY): Payer: Self-pay

## 2024-01-17 ENCOUNTER — Ambulatory Visit: Payer: Self-pay | Attending: Cardiology | Admitting: Cardiology

## 2024-01-17 ENCOUNTER — Encounter: Payer: Self-pay | Admitting: Cardiology

## 2024-01-17 VITALS — BP 118/72 | HR 77 | Resp 16 | Ht 71.0 in | Wt 243.4 lb

## 2024-01-17 DIAGNOSIS — E782 Mixed hyperlipidemia: Secondary | ICD-10-CM

## 2024-01-17 DIAGNOSIS — R931 Abnormal findings on diagnostic imaging of heart and coronary circulation: Secondary | ICD-10-CM | POA: Diagnosis not present

## 2024-01-17 DIAGNOSIS — I2510 Atherosclerotic heart disease of native coronary artery without angina pectoris: Secondary | ICD-10-CM | POA: Insufficient documentation

## 2024-01-17 MED ORDER — ICOSAPENT ETHYL 1 G PO CAPS
2.0000 g | ORAL_CAPSULE | Freq: Two times a day (BID) | ORAL | 3 refills | Status: AC
  Filled 2024-01-17: qty 120, 30d supply, fill #0

## 2024-01-17 NOTE — Patient Instructions (Addendum)
 Medication Instructions:  START VASCEPA  2 G TWICE DAILY   *If you need a refill on your cardiac medications before your next appointment, please call your pharmacy*  Lab Work: FASTING LIPID PANEL IN 02/2024  If you have labs (blood work) drawn today and your tests are completely normal, you will receive your results only by: MyChart Message (if you have MyChart) OR A paper copy in the mail If you have any lab test that is abnormal or we need to change your treatment, we will call you to review the results.  Testing/Procedures: EXERCISE NUCLEAR STRESS TEST   Your physician has requested that you have en exercise stress myoview. For further information please visit [email or web address] Please follow instruction sheet, as given.  ECHOCARDIOGRAM  Your physician has requested that you have an echocardiogram. Echocardiography is a painless test that uses sound waves to create images of your heart. It provides your doctor with information about the size and shape of your heart and how well your heart's chambers and valves are working. This procedure takes approximately one hour. There are no restrictions for this procedure. Please do NOT wear cologne, perfume, aftershave, or lotions (deodorant is allowed). Please arrive 15 minutes prior to your appointment time.  Please note: We ask at that you not bring children with you during ultrasound (echo/ vascular) testing. Due to room size and safety concerns, children are not allowed in the ultrasound rooms during exams. Our front office staff cannot provide observation of children in our lobby area while testing is being conducted. An adult accompanying a patient to their appointment will only be allowed in the ultrasound room at the discretion of the ultrasound technician under special circumstances. We apologize for any inconvenience.    Follow-Up: At [facility], you and your health needs are our priority.  As part of our continuing  mission to provide you with exceptional heart care, our providers are all part of one team.  This team includes your primary Cardiologist (physician) and Advanced Practice Providers or APPs (Physician Assistants and Nurse Practitioners) who all work together to provide you with the care you need, when you need it.  Your next appointment:   03/2024  Provider:   Newman JINNY Lawrence, MD

## 2024-01-17 NOTE — Progress Notes (Signed)
 Cardiology Office Note:  .   Date:  01/17/2024  ID:  Chad Moran, DOB May 17, 1971, MRN [id number] PCP: Loreli Elsie JONETTA Mickey., MD  [facility] Providers Cardiologist:  Newman Lawrence, MD PCP: Loreli Elsie JONETTA Mickey., MD  Chief Complaint  Patient presents with   Elevated coronary artery calcium  score   Follow-up     Chad Moran is a 53 y.o. male with hypertension, mixed hyperlipidemia, triglyceridemia, coronary atherosclerosis, elevated CAC, OSA on CPAP  History of Present Illness  Patient was previously seen by me in 2023.  He had an echocardiogram and monitor then, details below.  His activity has been down lately due to COVID etc., but had been active before that without any complaints of chest pain, shortness of breath.  He does have strong family history of elevated triglycerides.  He has had personal history of elevated triglycerides for many years.  He is currently on Crestor  5 mg every other day.  He relates that he has tried other medications including fenofibrate and Vascepa , but does not remember having significant improvement.  That said, it has been a while since he has been on any medications other than Crestor  for his lipids.    Vitals:   01/17/24 0852  BP: 118/72  Pulse: 77  Resp: 16  SpO2: 93%      Review of Systems  Cardiovascular:  Negative for chest pain, dyspnea on exertion, leg swelling, palpitations and syncope.        Studies Reviewed: SABRA        EKG 01/17/2024: Normal sinus rhythm Low voltage QRS Nonspecific T wave abnormality When compared with ECG of 20-Jun-2014 22:29, No significant change was found  CT cardiac scoring 12/2023: Left main: 0 Left anterior descending artery: 283 Left circumflex artery: 0 Right coronary artery: 185   Total: 468 Percentile: 96   Pericardium: Normal. Ascending Aorta: Normal caliber.   Pulmonary artery: Dilated main pulmonary artery measured 32mm Non-cardiac: See separate report from  [facility].   IMPRESSION: 1. Coronary calcium  score of 468. This was 96th percentile for age-, race-, and sex-matched controls. 2.  Dilated main pulmonary artery measured 32 mm  1. No acute extracardiac findings. 2. Unchanged 3 mm right lower lobe pulmonary nodule from 2018 exam, considered benign. This needs no further imaging follow-up. 3. Plaque-like area along the right diaphragm is unchanged in size from prior, likely benign. This also needs no further imaging follow-up. 4. Mild diffuse hepatic steatosis.  Echocardiogram 2023 Normal LV systolic function with EF 57%. Left ventricle cavity is normal in size. Normal left ventricular wall thickness. Normal global wall motion. Right ventricle cavity is mildly dilated. Normal right ventricular function. Structurally normal tricuspid valve.  Mild tricuspid regurgitation. RVSP measures 30 mmHg. Pericardium is normal. Small pericardial effusion. There is no hemodynamic significance.   Mobile cardiac telemetry 12 days 11/30/2021 - 12/13/2021: Dominant rhythm: Sinus. HR 55-160 bpm. Avg HR 89 bpm, in sinus rhythm. 1 episode of atrial tachycardia at 184 bpm for 5 beats. <1% isolated SVE, couplet/triplets. 0 episodes of VT. <1% isolated VE, no couplet/triplets. No atrial fibrillation/atrial flutter/VT/high grade AV block, sinus pause >3sec noted. 3 patient triggered events, correlated with sinus tachycardia.  Labs 11/2023: Chol 204, TG 427, HDL 38, LDL 95 HbA1C 5.3% Hb 15.4 Cr 1.0  Labs 2023: Chol 194, TG 504, HDL 34, LDL 79 Hb 14.3 Cr 9.02, Na 134 TSH 2.3   Physical Exam Vitals and nursing note reviewed.  Constitutional:  General: He is not in acute distress. Neck:     Vascular: No JVD.  Cardiovascular:     Rate and Rhythm: Normal rate and regular rhythm.     Heart sounds: Normal heart sounds. No murmur heard. Pulmonary:     Effort: Pulmonary effort is normal.     Breath sounds: Normal breath sounds.  No wheezing or rales.  Musculoskeletal:     Right lower leg: No edema.     Left lower leg: No edema.      VISIT DIAGNOSES:   ICD-10-CM   1. Elevated coronary artery calcium  score  R93.1 EKG 12-Lead    2. Mixed hyperlipidemia  E78.2 Lipid panel    3. Coronary artery disease involving native coronary artery of native heart without angina pectoris  I25.10 Myocardial Perfusion Imaging    ECHOCARDIOGRAM COMPLETE    Cardiac Stress Test: Informed Consent Details: Physician/Practitioner Attestation; Transcribe to consent form and obtain patient signature       Chad Moran is a 53 y.o. male with hypertension, mixed hyperlipidemia, triglyceridemia, coronary atherosclerosis, elevated CAC, OSA on CPAP Assessment & Plan  Elevated coronary calcium  score: Calcification in the LAD and RCA, 96 percentile. Recommend exercise nuclear stress test and echocardiogram for risk stratification. Controlled LDL, but significantly elevated triglycerides in a nondiabetic patient. Strong family history of elevated triglycerides. In addition to cutting down on simple carbohydrate and high saturated fat diet, recommend adding Vascepa  2 g twice daily. Check fasting lipid panel in 2 months.  Mildly dilated pulmonary artery: Nonspecific finding, but needs further evaluation with echocardiogram.     Informed Consent   Shared Decision Making/Informed Consent The risks [chest pain, shortness of breath, cardiac arrhythmias, dizziness, blood pressure fluctuations, myocardial infarction, stroke/transient ischemic attack, nausea, vomiting, allergic reaction, radiation exposure, metallic taste sensation and life-threatening complications (estimated to be 1 in 10,000)], benefits (risk stratification, diagnosing coronary artery disease, treatment guidance) and alternatives of a nuclear stress test were discussed in detail with Mr. Copher and he agrees to proceed.       Meds ordered this encounter  Medications    icosapent  Ethyl (VASCEPA ) 1 g capsule    Sig: Take 2 capsules (2 g total) by mouth 2 (two) times daily.    Dispense:  360 capsule    Refill:  3     F/u in 3 months  Signed, Newman JINNY Lawrence, MD

## 2024-01-18 ENCOUNTER — Other Ambulatory Visit (HOSPITAL_COMMUNITY): Payer: Self-pay

## 2024-01-18 ENCOUNTER — Telehealth: Payer: Self-pay | Admitting: Pharmacy Technician

## 2024-01-18 DIAGNOSIS — E782 Mixed hyperlipidemia: Secondary | ICD-10-CM

## 2024-01-18 NOTE — Telephone Encounter (Signed)
   Pharmacy Patient Advocate Encounter   Received notification from CoverMyMeds that prior authorization for icosapent  is required/requested.   Insurance verification completed.   The patient is insured through [facility] .   Per test claim: PA required; PA submitted to above mentioned insurance via Latent Key/confirmation #/EOC [id number] Status is pending

## 2024-01-22 ENCOUNTER — Other Ambulatory Visit: Payer: Self-pay

## 2024-01-22 MED ORDER — ROSUVASTATIN CALCIUM 10 MG PO TABS: 10.0000 mg | ORAL_TABLET | Freq: Every day | ORAL | 3 refills | Status: AC

## 2024-01-23 ENCOUNTER — Telehealth: Payer: Self-pay | Admitting: Pharmacy Technician

## 2024-01-23 ENCOUNTER — Other Ambulatory Visit (HOSPITAL_COMMUNITY): Payer: Self-pay

## 2024-01-23 NOTE — Telephone Encounter (Signed)
 Will work on appeal in new encounter

## 2024-01-23 NOTE — Telephone Encounter (Signed)
 Patient has not tolerate more than Crestor  5 mg dose due to myalgias. Ezetimibe would not help reduce triglycerides. He has strong family history of CAD and personally has coronary calcification. Please consider appealing the denial.  Thanks MJP

## 2024-01-23 NOTE — Telephone Encounter (Signed)
 Pharmacy Patient Advocate Encounter  Received notification from [employer] that Prior Authorization for icosapent  1gm has been DENIED.  See denial reason below. No denial letter attached in CMM. Will attach denial letter to Media tab once received.   PA #/Case ID/Reference #: [id number]

## 2024-01-23 NOTE — Telephone Encounter (Signed)
 Chad Moran

## 2024-01-23 NOTE — Telephone Encounter (Signed)
 Routed to primary cardiologist for review of med denied.

## 2024-01-25 NOTE — Telephone Encounter (Signed)
 Faxed more info [phone] 01/25/24 12:27p

## 2024-01-28 ENCOUNTER — Other Ambulatory Visit (HOSPITAL_COMMUNITY): Payer: Self-pay

## 2024-01-29 ENCOUNTER — Other Ambulatory Visit (HOSPITAL_COMMUNITY): Payer: Self-pay

## 2024-01-30 ENCOUNTER — Other Ambulatory Visit (HOSPITAL_COMMUNITY): Payer: Self-pay

## 2024-01-31 ENCOUNTER — Other Ambulatory Visit (HOSPITAL_COMMUNITY): Payer: Self-pay

## 2024-02-01 ENCOUNTER — Other Ambulatory Visit (HOSPITAL_COMMUNITY): Payer: Self-pay

## 2024-02-01 NOTE — Telephone Encounter (Signed)
 Sent a message to insurance to follow up on insurance

## 2024-02-04 ENCOUNTER — Other Ambulatory Visit (HOSPITAL_COMMUNITY): Payer: Self-pay

## 2024-02-04 NOTE — Telephone Encounter (Signed)
 Faxed to another number per insurance [phone]

## 2024-02-06 NOTE — Telephone Encounter (Signed)
 Faxed to another number  [phone]

## 2024-02-12 ENCOUNTER — Other Ambulatory Visit (HOSPITAL_COMMUNITY): Payer: Self-pay

## 2024-02-12 NOTE — Telephone Encounter (Signed)
 Faxed urgent appeal request [phone]

## 2024-02-15 ENCOUNTER — Other Ambulatory Visit (HOSPITAL_COMMUNITY): Payer: Self-pay

## 2024-02-15 NOTE — Telephone Encounter (Signed)
 [id number]  They asked for this appeal to be mailed to:  [address] [address] [place], [place] , [phone]  Also faxed again to [phone]

## 2024-02-18 ENCOUNTER — Other Ambulatory Visit (HOSPITAL_COMMUNITY): Payer: Self-pay

## 2024-02-19 ENCOUNTER — Encounter (HOSPITAL_COMMUNITY): Payer: Self-pay

## 2024-02-29 ENCOUNTER — Other Ambulatory Visit (HOSPITAL_COMMUNITY): Payer: Self-pay

## 2024-02-29 ENCOUNTER — Other Ambulatory Visit: Payer: Self-pay | Admitting: Cardiology

## 2024-02-29 DIAGNOSIS — I2510 Atherosclerotic heart disease of native coronary artery without angina pectoris: Secondary | ICD-10-CM

## 2024-02-29 NOTE — Telephone Encounter (Signed)
 Let us  add fenofibrate 145 mg daily.  In addition, would he qualify for any clinical trial?  Thanks MJP

## 2024-02-29 NOTE — Telephone Encounter (Signed)
 Appeal was denied case [id number]. Appeal scanned in media

## 2024-02-29 NOTE — Telephone Encounter (Signed)
 Appeal number [id number] M and [id number] K  This was denied twice. They are sending me the denial via fax   Ph [phone]

## 2024-02-29 NOTE — Telephone Encounter (Signed)
 Yes please. Anyway, looks like Vascepa  was denied by insurance, see above.  Thanks MJP

## 2024-02-29 NOTE — Telephone Encounter (Signed)
 Spoke to patient he reports that with use of Vascepa  he was feel like his heart was fluttering. Pt admits that he was shaking as well. Pt stopped use of Vascepa  1 week ago. Do you want me to move forward with starting fenofibrate to replace Vascepa ?

## 2024-02-29 NOTE — Telephone Encounter (Signed)
Sent to provider in another encounter

## 2024-03-03 ENCOUNTER — Telehealth (HOSPITAL_COMMUNITY): Payer: Self-pay

## 2024-03-03 NOTE — Telephone Encounter (Signed)
 Detailed instructions left on the patient's answering machine. Asked to call back with any questions.  S.Alessa Mazur CCT

## 2024-03-03 NOTE — Telephone Encounter (Signed)
 Left message to call back.

## 2024-03-04 ENCOUNTER — Ambulatory Visit (HOSPITAL_BASED_OUTPATIENT_CLINIC_OR_DEPARTMENT_OTHER)
Admission: RE | Admit: 2024-03-04 | Discharge: 2024-03-04 | Disposition: A | Discharge status: Home / Self Care | Source: Ambulatory Visit | Attending: Cardiology | Admitting: Cardiology

## 2024-03-04 ENCOUNTER — Ambulatory Visit: Payer: Self-pay | Admitting: Cardiology

## 2024-03-04 ENCOUNTER — Ambulatory Visit (HOSPITAL_COMMUNITY)
Admission: RE | Admit: 2024-03-04 | Discharge: 2024-03-04 | Disposition: A | Discharge status: Home / Self Care | Source: Ambulatory Visit | Attending: Internal Medicine | Admitting: Internal Medicine

## 2024-03-04 DIAGNOSIS — I2510 Atherosclerotic heart disease of native coronary artery without angina pectoris: Secondary | ICD-10-CM

## 2024-03-04 LAB — MYOCARDIAL PERFUSION IMAGING
Angina Index: 0
Duke Treadmill Score: 9
Exercise duration (min): 8 min
Exercise duration (sec): 30 s
LV dias vol: 140 mL (ref 62–150)
LV sys vol: 53 mL (ref 4.2–5.8)
Nuc Stress EF: 62 %
Rest Nuclear Isotope Dose: 10.2 mCi
SDS: 0
SRS: 0
SSS: 0
ST Depression (mm): 0 mm
Stress Nuclear Isotope Dose: 32.7 mCi
TID: 1.06

## 2024-03-04 LAB — ECHOCARDIOGRAM COMPLETE
Area-P 1/2: 4.02 cm2
S' Lateral: 3.4 cm

## 2024-03-04 MED ORDER — FENOFIBRATE 145 MG PO TABS: 145.0000 mg | ORAL_TABLET | Freq: Every day | ORAL | 3 refills | Status: AC

## 2024-03-04 MED ORDER — TECHNETIUM TC 99M TETROFOSMIN IV KIT
10.2000 | PACK | Freq: Once | INTRAVENOUS | Status: AC | PRN
  Administered 2024-03-04: 10.2 via INTRAVENOUS

## 2024-03-04 MED ORDER — TECHNETIUM TC 99M TETROFOSMIN IV KIT
32.7000 | PACK | Freq: Once | INTRAVENOUS | Status: AC | PRN
  Administered 2024-03-04: 32.7 via INTRAVENOUS

## 2024-03-04 NOTE — Addendum Note (Signed)
 Addended by: MANDA BOTTCHER B on: 03/04/2024 04:17 PM   Modules accepted: Orders

## 2024-03-04 NOTE — Telephone Encounter (Signed)
 Prescription for fenofibrate placed and pt would like to know if there is a alternative to statin therapy? Pt admits that since starting the use of statin he has noticed foggy headed and forgetful.

## 2024-03-05 NOTE — Telephone Encounter (Signed)
 Known statin intolerance. Okay to hold for now. Continue fenofibrate. We will check fasting lipid panel before next office visit in 03/2024.  Thanks MJP

## 2024-03-10 NOTE — Telephone Encounter (Signed)
 Left message to call back.

## 2024-03-11 NOTE — Addendum Note (Signed)
 Addended by: MANDA BOTTCHER B on: 03/11/2024 10:58 AM   Modules accepted: Orders

## 2024-03-11 NOTE — Telephone Encounter (Signed)
 Pt contacted and advised Dr. [place] message. Pt agrees with plan of care. Lipid panel order placed.

## 2024-04-15 ENCOUNTER — Encounter: Payer: Self-pay | Admitting: Cardiology

## 2024-04-15 ENCOUNTER — Ambulatory Visit: Payer: Self-pay | Attending: Cardiology | Admitting: Cardiology

## 2024-04-15 VITALS — BP 106/74 | HR 65 | Ht 70.0 in | Wt 242.0 lb

## 2024-04-15 DIAGNOSIS — E782 Mixed hyperlipidemia: Secondary | ICD-10-CM | POA: Diagnosis not present

## 2024-04-15 DIAGNOSIS — I2510 Atherosclerotic heart disease of native coronary artery without angina pectoris: Secondary | ICD-10-CM | POA: Diagnosis not present

## 2024-04-15 DIAGNOSIS — I517 Cardiomegaly: Secondary | ICD-10-CM | POA: Insufficient documentation

## 2024-04-15 MED ORDER — ASPIRIN 81 MG PO TBEC
81.0000 mg | DELAYED_RELEASE_TABLET | Freq: Every day | ORAL | 3 refills | Status: AC
Start: 2024-04-15 — End: ?

## 2024-04-15 NOTE — Progress Notes (Signed)
 Cardiology Office Note:  .   Date:  04/15/2024  ID:  [name], DOB 24-Jun-1970, MRN [id number] PCP: Dayna Motto, DO  [facility] Providers Cardiologist:  Newman Lawrence, MD PCP: Dayna Motto, DO  Chief Complaint  Patient presents with   Coronary calcification     ELJAY LAVE is a 53 y.o. male with hypertension, mixed hyperlipidemia, triglyceridemia, coronary atherosclerosis, elevated CAC, OSA on CPAP  History of Present Illness  Patient is doing well coronary artery complaints.  He is making changes to his diet and is also exercising regularly.  He did not tolerate anything more than rosuvastatin  5 mg daily due to myalgias.  He also did not tolerate Vascepa  as it made him feel shaky.  He is currently taking fenofibrate  alone as a lipid/triglyceride lowering agent.  Reviewed stress test results with the patient, details below  Vitals:   04/15/24 0751  BP: 106/74  Pulse: 65  SpO2: 94%      Review of Systems  Cardiovascular:  Negative for chest pain, dyspnea on exertion, leg swelling, palpitations and syncope.        Studies Reviewed: SABRA        EKG 01/17/2024: Normal sinus rhythm Low voltage QRS Nonspecific T wave abnormality When compared with ECG of 20-Jun-2014 22:29, No significant change was found  Stress test 02/2024:   Findings are consistent with no ischemia. The study is low risk.   ECG was interpretable and conclusive. The ECG was negative for ischemia.   LV perfusion is normal.   Left ventricular function is normal. Nuclear stress EF: 62%. The left ventricular ejection fraction is normal (55-65%). End diastolic cavity size is mildly enlarged. End systolic cavity size is normal.   CT images were obtained for attenuation correction and were examined for the presence of coronary calcium  when appropriate.   Coronary calcium  was present on the attenuation correction CT images. Moderate coronary calcifications were present. Coronary  calcifications were present in the left anterior descending artery and right coronary artery distribution(s).   Prior study not available for comparison.   No ischemia. LVEF 62% with normal wall motion. Calcium  noted in the LAD and RCA. Overall low risk study. No prior for comparison.   Echocardiogram 02/2024: 1. Left ventricular ejection fraction, by estimation, is 55 to 60%. The  left ventricle has normal function. The left ventricle has no regional  wall motion abnormalities. Left ventricular diastolic parameters were  normal.   2. Right ventricular systolic function is normal. The right ventricular  size is mildly enlarged. Tricuspid regurgitation signal is inadequate for  assessing PA pressure.   3. The mitral valve is normal in structure. Trivial mitral valve  regurgitation. No evidence of mitral stenosis.   4. The aortic valve is tricuspid. Aortic valve regurgitation is not  visualized. Aortic valve sclerosis/calcification is present, without any  evidence of aortic stenosis.   5. The inferior vena cava is normal in size with greater than 50%  respiratory variability, suggesting right atrial pressure of 3 mmHg.    CT cardiac scoring 12/2023: Left main: 0 Left anterior descending artery: 283 Left circumflex artery: 0 Right coronary artery: 185   Total: 468 Percentile: 96   Pericardium: Normal. Ascending Aorta: Normal caliber.   Pulmonary artery: Dilated main pulmonary artery measured 32mm Non-cardiac: See separate report from [facility].   IMPRESSION: 1. Coronary calcium  score of 468. This was 96th percentile for age-, race-, and sex-matched controls. 2.  Dilated main pulmonary artery measured  32 mm  1. No acute extracardiac findings. 2. Unchanged 3 mm right lower lobe pulmonary nodule from 2018 exam, considered benign. This needs no further imaging follow-up. 3. Plaque-like area along the right diaphragm is unchanged in size from prior, likely benign.  This also needs no further imaging follow-up. 4. Mild diffuse hepatic steatosis.  Echocardiogram 2023: Normal LV systolic function with EF 57%. Left ventricle cavity is normal in size. Normal left ventricular wall thickness. Normal global wall motion. Right ventricle cavity is mildly dilated. Normal right ventricular function. Structurally normal tricuspid valve.  Mild tricuspid regurgitation. RVSP measures 30 mmHg. Pericardium is normal. Small pericardial effusion. There is no hemodynamic significance.  Labs 11/2023: Chol 204, TG 427, HDL 38, LDL 95 HbA1C 5.3% Hb 15.4 Cr 1.0   Physical Exam Vitals and nursing note reviewed.  Constitutional:      General: He is not in acute distress. Neck:     Vascular: No JVD.  Cardiovascular:     Rate and Rhythm: Normal rate and regular rhythm.     Heart sounds: Normal heart sounds. No murmur heard. Pulmonary:     Effort: Pulmonary effort is normal.     Breath sounds: Normal breath sounds. No wheezing or rales.  Musculoskeletal:     Right lower leg: No edema.     Left lower leg: No edema.      VISIT DIAGNOSES:   ICD-10-CM   1. Mixed hyperlipidemia  E78.2 Lipid panel    Lipoprotein A (LPA)    AMB Referral to [facility]    2. Coronary artery calcification  I25.10     3. Right ventricular enlargement  I51.7 ECHOCARDIOGRAM COMPLETE        ERCEL PEPITONE is a 53 y.o. male with hypertension, mixed hyperlipidemia, triglyceridemia, coronary atherosclerosis, elevated CAC, OSA on CPAP Assessment & Plan  Elevated coronary calcium  score: Calcification in the LAD and RCA, 96 percentile (12/2023). No ischemia on stress testing (02/2024). Given high amount of calcification, reasonable to take aspirin  81 mg daily. Intolerant to statins due to myalgias, did not tolerate Vascepa  due to other side effects. Current taking fenofibrate . Check fasting lipid panel and lipoprotein a. If LDL remains elevated, he will likely need PCSK9  inhibitors.  Will refer to lipid clinic. In addition, I we also need to consider newer agents for triglycerides, if they remain elevated.  Mildly dilated RV: Also noted to have mildly dilated pulmonary artery on CT scan. No signs/symptoms of RV dysfunction. I suspect this could be related to OSA. Repeat echocardiogram in 1 year.        Meds ordered this encounter  Medications   aspirin  EC 81 MG tablet    Sig: Take 1 tablet (81 mg total) by mouth daily. Swallow whole.    Dispense:  90 tablet    Refill:  3     F/u in 3 months  Signed, Newman JINNY Lawrence, MD

## 2024-04-15 NOTE — Patient Instructions (Addendum)
 Medication: START Aspirin  81 mg daily    Lab Work: LP(A) LIPID PANEL   If you have labs (blood work) drawn today and your tests are completely normal, you will receive your results only by: MyChart Message (if you have MyChart) OR A paper copy in the mail If you have any lab test that is abnormal or we need to change your treatment, we will call you to review the results.  REFERRAL TO PHARM D   ECHOCARDIOGRAM  Your physician has requested that you have an echocardiogram. Echocardiography is a painless test that uses sound waves to create images of your heart. It provides your doctor with information about the size and shape of your heart and how well your heart's chambers and valves are working. This procedure takes approximately one hour. There are no restrictions for this procedure. Please do NOT wear cologne, perfume, aftershave, or lotions (deodorant is allowed). Please arrive 15 minutes prior to your appointment time.  Please note: We ask at that you not bring children with you during ultrasound (echo/ vascular) testing. Due to room size and safety concerns, children are not allowed in the ultrasound rooms during exams. Our front office staff cannot provide observation of children in our lobby area while testing is being conducted. An adult accompanying a patient to their appointment will only be allowed in the ultrasound room at the discretion of the ultrasound technician under special circumstances. We apologize for any inconvenience.   Follow-Up: At [facility], you and your health needs are our priority.  As part of our continuing mission to provide you with exceptional heart care, our providers are all part of one team.  This team includes your primary Cardiologist (physician) and Advanced Practice Providers or APPs (Physician Assistants and Nurse Practitioners) who all work together to provide you with the care you need, when you need it.  Your next appointment:   1  year(s)  Provider:   Newman JINNY Lawrence, MD

## 2024-04-16 ENCOUNTER — Ambulatory Visit: Payer: Self-pay | Admitting: Cardiology

## 2024-04-16 DIAGNOSIS — R931 Abnormal findings on diagnostic imaging of heart and coronary circulation: Secondary | ICD-10-CM

## 2024-04-16 DIAGNOSIS — Z789 Other specified health status: Secondary | ICD-10-CM

## 2024-04-16 LAB — LIPID PANEL
Chol/HDL Ratio: 5.1 ratio — ABNORMAL HIGH (ref 0.0–5.0)
Cholesterol, Total: 194 mg/dL (ref 100–199)
HDL: 38 mg/dL — ABNORMAL LOW (ref >39)
LDL Chol Calc (NIH): 127 mg/dL — ABNORMAL HIGH (ref 0–99)
Triglycerides: 160 mg/dL — ABNORMAL HIGH (ref 0–149)
VLDL Cholesterol Cal: 29 mg/dL (ref 5–40)

## 2024-04-16 LAB — LIPOPROTEIN A (LPA): Lipoprotein (a): 32 nmol/L (ref <75.0)

## 2024-04-16 NOTE — Progress Notes (Signed)
 MyChart message containing providers result note and interpretation read by patient: Last read by Chad Moran at 6:41PM on 04/16/2024.

## 2024-04-16 NOTE — Progress Notes (Signed)
 Triglycerides are much improved.  Continue fenofibrate . LDL remains elevated at 127. Given coronary calcification, and statin intolerance, recommend referral to lipid clinic for consideration for injectable agents.  Thanks MJP

## 2024-12-09 ENCOUNTER — Telehealth: Admitting: Family Medicine

## 2024-12-11 ENCOUNTER — Telehealth: Payer: Self-pay | Admitting: Family Medicine
